# Patient Record
Sex: Female | Born: 1965 | Race: White | Hispanic: No | Marital: Married | Smoking: Current every day smoker
Health system: Southern US, Community
[De-identification: ages and names within clinical notes are randomized; demographics above are authoritative.]

## PROBLEM LIST (undated history)

## (undated) DIAGNOSIS — C801 Malignant (primary) neoplasm, unspecified: Secondary | ICD-10-CM

## (undated) DIAGNOSIS — F329 Major depressive disorder, single episode, unspecified: Secondary | ICD-10-CM

## (undated) DIAGNOSIS — F32A Depression, unspecified: Secondary | ICD-10-CM

## (undated) DIAGNOSIS — N200 Calculus of kidney: Secondary | ICD-10-CM

## (undated) HISTORY — PX: FRACTURE SURGERY: SHX138

---

## 2004-08-29 ENCOUNTER — Emergency Department (HOSPITAL_COMMUNITY): Admission: EM | Admit: 2004-08-29 | Discharge: 2004-08-29 | Payer: Self-pay | Admitting: *Deleted

## 2008-04-11 ENCOUNTER — Emergency Department: Payer: Self-pay | Admitting: Emergency Medicine

## 2009-05-01 ENCOUNTER — Emergency Department: Payer: Self-pay | Admitting: Internal Medicine

## 2014-08-11 ENCOUNTER — Emergency Department: Payer: Commercial Indemnity

## 2014-08-11 ENCOUNTER — Emergency Department
Admission: EM | Admit: 2014-08-11 | Discharge: 2014-08-12 | Disposition: A | Discharge status: Other Acute Inpatient Hospital | Payer: Commercial Indemnity | Attending: Emergency Medicine | Admitting: Emergency Medicine

## 2014-08-11 ENCOUNTER — Encounter: Payer: Self-pay | Admitting: Emergency Medicine

## 2014-08-11 DIAGNOSIS — F329 Major depressive disorder, single episode, unspecified: Secondary | ICD-10-CM | POA: Diagnosis not present

## 2014-08-11 DIAGNOSIS — Y9389 Activity, other specified: Secondary | ICD-10-CM | POA: Insufficient documentation

## 2014-08-11 DIAGNOSIS — X788XXA Intentional self-harm by other sharp object, initial encounter: Secondary | ICD-10-CM | POA: Insufficient documentation

## 2014-08-11 DIAGNOSIS — R45851 Suicidal ideations: Secondary | ICD-10-CM

## 2014-08-11 DIAGNOSIS — F1210 Cannabis abuse, uncomplicated: Secondary | ICD-10-CM

## 2014-08-11 DIAGNOSIS — K0889 Other specified disorders of teeth and supporting structures: Secondary | ICD-10-CM

## 2014-08-11 DIAGNOSIS — Y998 Other external cause status: Secondary | ICD-10-CM | POA: Diagnosis not present

## 2014-08-11 DIAGNOSIS — Y9289 Other specified places as the place of occurrence of the external cause: Secondary | ICD-10-CM | POA: Insufficient documentation

## 2014-08-11 DIAGNOSIS — S61512A Laceration without foreign body of left wrist, initial encounter: Secondary | ICD-10-CM | POA: Diagnosis not present

## 2014-08-11 DIAGNOSIS — Z3202 Encounter for pregnancy test, result negative: Secondary | ICD-10-CM | POA: Diagnosis not present

## 2014-08-11 DIAGNOSIS — N946 Dysmenorrhea, unspecified: Secondary | ICD-10-CM

## 2014-08-11 DIAGNOSIS — F322 Major depressive disorder, single episode, severe without psychotic features: Secondary | ICD-10-CM

## 2014-08-11 DIAGNOSIS — Z720 Tobacco use: Secondary | ICD-10-CM | POA: Insufficient documentation

## 2014-08-11 DIAGNOSIS — F1490 Cocaine use, unspecified, uncomplicated: Secondary | ICD-10-CM

## 2014-08-11 LAB — COMPREHENSIVE METABOLIC PANEL
ALBUMIN: 4.3 g/dL (ref 3.5–5.0)
ALK PHOS: 51 U/L (ref 38–126)
ALT: 10 U/L — ABNORMAL LOW (ref 14–54)
ANION GAP: 8 (ref 5–15)
AST: 16 U/L (ref 15–41)
BILIRUBIN TOTAL: 0.7 mg/dL (ref 0.3–1.2)
BUN: 11 mg/dL (ref 6–20)
CO2: 24 mmol/L (ref 22–32)
CREATININE: 0.6 mg/dL (ref 0.44–1.00)
Calcium: 8.8 mg/dL — ABNORMAL LOW (ref 8.9–10.3)
Chloride: 107 mmol/L (ref 101–111)
GFR calc Af Amer: >60 mL/min (ref >60)
GFR calc non Af Amer: >60 mL/min (ref >60)
GLUCOSE: 118 mg/dL — AB (ref 65–99)
POTASSIUM: 3.8 mmol/L (ref 3.5–5.1)
Sodium: 139 mmol/L (ref 135–145)
Total Protein: 7.7 g/dL (ref 6.5–8.1)

## 2014-08-11 LAB — URINALYSIS COMPLETE WITH MICROSCOPIC (ARMC ONLY)
BACTERIA UA: NONE SEEN
BILIRUBIN URINE: NEGATIVE
GLUCOSE, UA: NEGATIVE mg/dL
KETONES UR: NEGATIVE mg/dL
Leukocytes, UA: NEGATIVE
Nitrite: NEGATIVE
PH: 7 (ref 5.0–8.0)
Protein, ur: NEGATIVE mg/dL
SPECIFIC GRAVITY, URINE: 1.005 (ref 1.005–1.030)

## 2014-08-11 LAB — URINE DRUG SCREEN, QUALITATIVE (ARMC ONLY)
Amphetamines, Ur Screen: NOT DETECTED
BARBITURATES, UR SCREEN: NOT DETECTED
Benzodiazepine, Ur Scrn: NOT DETECTED
Cannabinoid 50 Ng, Ur ~~LOC~~: POSITIVE — AB
Cocaine Metabolite,Ur ~~LOC~~: POSITIVE — AB
MDMA (Ecstasy)Ur Screen: NOT DETECTED
Methadone Scn, Ur: NOT DETECTED
OPIATE, UR SCREEN: NOT DETECTED
Phencyclidine (PCP) Ur S: NOT DETECTED
Tricyclic, Ur Screen: NOT DETECTED

## 2014-08-11 LAB — ACETAMINOPHEN LEVEL: Acetaminophen (Tylenol), Serum: <10 ug/mL — ABNORMAL LOW (ref 10–30)

## 2014-08-11 LAB — CBC
HCT: 40.5 % (ref 35.0–47.0)
Hemoglobin: 13.2 g/dL (ref 12.0–16.0)
MCH: 28.6 pg (ref 26.0–34.0)
MCHC: 32.6 g/dL (ref 32.0–36.0)
MCV: 87.7 fL (ref 80.0–100.0)
PLATELETS: 455 10*3/uL — AB (ref 150–440)
RBC: 4.62 MIL/uL (ref 3.80–5.20)
RDW: 13.4 % (ref 11.5–14.5)
WBC: 16.1 10*3/uL — AB (ref 3.6–11.0)

## 2014-08-11 LAB — SALICYLATE LEVEL

## 2014-08-11 LAB — PREGNANCY, URINE: Preg Test, Ur: NEGATIVE

## 2014-08-11 LAB — ETHANOL: Alcohol, Ethyl (B): <5 mg/dL (ref <5)

## 2014-08-11 MED ORDER — LIDOCAINE-EPINEPHRINE (PF) 1 %-1:200000 IJ SOLN
INTRAMUSCULAR | Status: AC
  Administered 2014-08-11: 13:00:00
  Filled 2014-08-11: qty 30

## 2014-08-11 MED ORDER — IBUPROFEN 600 MG PO TABS
600.0000 mg | ORAL_TABLET | Freq: Once | ORAL | Status: AC
  Administered 2014-08-11: 600 mg via ORAL

## 2014-08-11 MED ORDER — LIDOCAINE-EPINEPHRINE 2 %-1:100000 IJ SOLN
INTRAMUSCULAR | Status: AC
  Filled 2014-08-11: qty 1.7

## 2014-08-11 MED ORDER — IBUPROFEN 600 MG PO TABS
ORAL_TABLET | ORAL | Status: AC
  Administered 2014-08-11: 600 mg via ORAL
  Filled 2014-08-11: qty 1

## 2014-08-11 NOTE — ED Notes (Signed)
Dr.Miller was paged @1022  no response. Called office @ 1245 ER Md discussed xray results with Dr.MIller   Lisa Roca, MD 08/11/14 1650

## 2014-08-11 NOTE — Consult Note (Signed)
[facility] Psychiatry Consult   Reason for Consult:  Suicide attempt by cutting of wrist Referring Physician:  Karma Greaser Patient Identification: Darlene Franco MRN:  [id number] Principal Diagnosis: Depression, major, single episode, severe Diagnosis:   Patient Active Problem List   Diagnosis Date Noted  . Depression, major, single episode, severe [F32.2] 08/11/2014  . Cocaine use [F14.10] 08/11/2014  . Marijuana abuse [F12.10] 08/11/2014    Total Time spent with patient: 1 hour  Subjective:   Darlene Franco is a 49 y.o. female patient admitted with cut wrist and states explicit suicidal ideation .  HPI:  Patient reports months of depressed mood and poor sleep, poor energy, suicidal ideation. Denies cocaine use  HPI Elements:   Quality:  depression. Severity:  severe. Timing:  worsening over weeks. Duration:  many months. Context:  likely substance abuse. Possible medical problems.  Past Medical History: No past medical history on file. History reviewed. No pertinent past surgical history. Family History: No family history on file. Social History:  History  Alcohol Use No     History  Drug Use  . Yes  . Special: Marijuana    History   Social History  . Marital Status: Married    Spouse Name: N/A  . Number of Children: N/A  . Years of Education: N/A   Social History Main Topics  . Smoking status: Current Every Day Smoker  . Smokeless tobacco: Not on file  . Alcohol Use: No  . Drug Use: Yes    Special: Marijuana  . Sexual Activity: Not on file   Other Topics Concern  . None   Social History Narrative  . None   Additional Social History:                          Allergies:  No Known Allergies  Labs:  Results for orders placed or performed during the hospital encounter of 08/11/14 (from the past 48 hour(s))  Acetaminophen level     Status: Abnormal   Collection Time: 08/11/14  9:10 AM  Result Value Ref Range   Acetaminophen  (Tylenol), Serum <10 (L) 10 - 30 ug/mL  CBC     Status: Abnormal   Collection Time: 08/11/14  9:10 AM  Result Value Ref Range   WBC 16.1 (H) 3.6 - 11.0 K/uL   RBC 4.62 3.80 - 5.20 MIL/uL   Hemoglobin 13.2 12.0 - 16.0 g/dL   HCT 40.5 35.0 - 47.0 %   MCV 87.7 80.0 - 100.0 fL   MCH 28.6 26.0 - 34.0 pg   MCHC 32.6 32.0 - 36.0 g/dL   RDW 13.4 11.5 - 14.5 %   Platelets 455 (H) 150 - 440 K/uL  Comprehensive metabolic panel     Status: Abnormal   Collection Time: 08/11/14  9:10 AM  Result Value Ref Range   Sodium 139 135 - 145 mmol/L   Potassium 3.8 3.5 - 5.1 mmol/L   Chloride 107 101 - 111 mmol/L   CO2 24 22 - 32 mmol/L   Glucose, Bld 118 (H) 65 - 99 mg/dL   BUN 11 6 - 20 mg/dL   Creatinine, Ser 0.60 0.44 - 1.00 mg/dL   Calcium 8.8 (L) 8.9 - 10.3 mg/dL   Total Protein 7.7 6.5 - 8.1 g/dL   Albumin 4.3 3.5 - 5.0 g/dL   AST 16 15 - 41 U/L   ALT 10 (L) 14 - 54 U/L   Alkaline Phosphatase 51 38 -  126 U/L   Total Bilirubin 0.7 0.3 - 1.2 mg/dL   GFR calc non Af Amer >60 >60 mL/min   GFR calc Af Amer >60 >60 mL/min    Comment: (NOTE) The eGFR has been calculated using the CKD EPI equation. This calculation has not been validated in all clinical situations. eGFR's persistently <90 mL/min signify possible Chronic Kidney Disease.    Anion gap 8 5 - 15  Ethanol (ETOH)     Status: None   Collection Time: 08/11/14  9:10 AM  Result Value Ref Range   Alcohol, Ethyl (B) <5 <5 mg/dL  Salicylate level     Status: None   Collection Time: 08/11/14  9:10 AM  Result Value Ref Range   Salicylate Lvl <7.0 2.8 - 30.0 mg/dL  Pregnancy, urine     Status: None   Collection Time: 08/11/14  9:29 AM  Result Value Ref Range   Preg Test, Ur NEGATIVE NEGATIVE  Urinalysis complete, with microscopic     Status: Abnormal   Collection Time: 08/11/14  9:29 AM  Result Value Ref Range   Color, Urine STRAW (A) YELLOW   APPearance CLEAR (A) CLEAR   Glucose, UA NEGATIVE NEGATIVE mg/dL   Bilirubin Urine NEGATIVE  NEGATIVE   Ketones, ur NEGATIVE NEGATIVE mg/dL   Specific Gravity, Urine 1.005 1.005 - 1.030   Hgb urine dipstick 2+ (A) NEGATIVE   pH 7.0 5.0 - 8.0   Protein, ur NEGATIVE NEGATIVE mg/dL   Nitrite NEGATIVE NEGATIVE   Leukocytes, UA NEGATIVE NEGATIVE   RBC / HPF 0-5 0 - 5 RBC/hpf   WBC, UA 0-5 0 - 5 WBC/hpf   Bacteria, UA NONE SEEN NONE SEEN   Squamous Epithelial / LPF 0-5 (A) NONE SEEN  Urine Drug Screen, Qualitative     Status: Abnormal   Collection Time: 08/11/14  9:29 AM  Result Value Ref Range   Tricyclic, Ur Screen NONE DETECTED NONE DETECTED   Amphetamines, Ur Screen NONE DETECTED NONE DETECTED   MDMA (Ecstasy)Ur Screen NONE DETECTED NONE DETECTED   Cocaine Metabolite,Ur [place] POSITIVE (A) NONE DETECTED   Opiate, Ur Screen NONE DETECTED NONE DETECTED   Phencyclidine (PCP) Ur S NONE DETECTED NONE DETECTED   Cannabinoid 50 Ng, Ur [place] POSITIVE (A) NONE DETECTED   Barbiturates, Ur Screen NONE DETECTED NONE DETECTED   Benzodiazepine, Ur Scrn NONE DETECTED NONE DETECTED   Methadone Scn, Ur NONE DETECTED NONE DETECTED    Comment: (NOTE) 340  Tricyclics, urine               Cutoff 1000 ng/mL 200  Amphetamines, urine             Cutoff 1000 ng/mL 300  MDMA (Ecstasy), urine           Cutoff 500 ng/mL 400  Cocaine Metabolite, urine       Cutoff 300 ng/mL 500  Opiate, urine                   Cutoff 300 ng/mL 600  Phencyclidine (PCP), urine      Cutoff 25 ng/mL 700  Cannabinoid, urine              Cutoff 50 ng/mL 800  Barbiturates, urine             Cutoff 200 ng/mL 900  Benzodiazepine, urine           Cutoff 200 ng/mL 1000 Methadone, urine  Cutoff 300 ng/mL 1100 1200 The urine drug screen provides only a preliminary, unconfirmed 1300 analytical test result and should not be used for non-medical 1400 purposes. Clinical consideration and professional judgment should 1500 be applied to any positive drug screen result due to possible 1600 interfering substances. A more  specific alternate chemical method 1700 must be used in order to obtain a confirmed analytical result.  1800 Gas chromato graphy / mass spectrometry (GC/MS) is the preferred 1900 confirmatory method.     Vitals: Blood pressure 123/71, pulse 72, temperature 98 F (36.7 C), temperature source Oral, resp. rate 20, height $RemoveBe'5\' 6"'BWAmeQGxn$  (1.676 m), weight 54.432 kg (120 lb), SpO2 94 %.  Risk to Self: Is patient at risk for suicide?: Yes Risk to Others:   Prior Inpatient Therapy:   Prior Outpatient Therapy:    No current facility-administered medications for this encounter.   No current outpatient prescriptions on file.    Musculoskeletal: Strength & Muscle Tone: increased Gait & Station: ataxic Patient leans: N/A  Psychiatric Specialty Exam: Physical Exam  Constitutional: She appears cachectic. She has a sickly appearance. She appears distressed.  Eyes: Pupils are equal, round, and reactive to light.  Neurological: She is alert.  Skin: She is diaphoretic.  Psychiatric: Her mood appears anxious. Her affect is labile. Her speech is rapid and/or pressured. She is agitated. Cognition and memory are impaired. She expresses impulsivity and inappropriate judgment. She exhibits a depressed mood. She expresses suicidal ideation. She expresses suicidal plans. She exhibits abnormal recent memory and abnormal remote memory.    Review of Systems  Constitutional: Positive for fever, weight loss, malaise/fatigue and diaphoresis.  Psychiatric/Behavioral: Positive for depression, suicidal ideas, memory loss and substance abuse. The patient is nervous/anxious and has insomnia.     Blood pressure 123/71, pulse 72, temperature 98 F (36.7 C), temperature source Oral, resp. rate 20, height $RemoveBe'5\' 6"'SbemSjBSW$  (1.676 m), weight 54.432 kg (120 lb), SpO2 94 %.Body mass index is 19.38 kg/(m^2).  General Appearance: Disheveled  Eye Contact::  Absent  Speech:  Pressured  Volume:  Increased  Mood:  Depressed  Affect:   Depressed and Tearful  Thought Process:  Disorganized  Orientation:  Full (Time, Place, and Person)  Thought Content:  WDL  Suicidal Thoughts:  Yes.  with intent/plan  Homicidal Thoughts:  No  Memory:  Immediate;   Poor Recent;   Poor Remote;   Poor  Judgement:  Impaired  Insight:  Lacking and Present  Psychomotor Activity:  Restlessness  Concentration:  Negative  Recall:  Negative  Fund of Knowledge:Fair  Language: Fair  Akathisia:  No  Handed:  Right  AIMS (if indicated):     Assets:  Housing  ADL's:  Impaired  Cognition: Impaired,  Moderate  Sleep:      Medical Decision Making: New problem, with additional work up planned, Review of Psycho-Social Stressors (1), Review or order clinical lab tests (1), Discuss test with performing physician (1) and Review of Medication Regimen & Side Effects (2)  Treatment Plan Summary: Plan Review labs. Admit to psychiatry. Precautions. Does not appear to need one on one sitter right now. Discuss plan with patient and ER physicain and psychiatric team  Plan:  Recommend psychiatric Inpatient admission when medically cleared. admit to psychiatry ttentative dx major depression single severe Disposition: admit under IVC  CLAPACS, [facility] 08/11/2014 9:55 PM

## 2014-08-11 NOTE — ED Notes (Signed)
BEHAVIORAL HEALTH ROUNDING Patient sleeping: Yes.   Patient alert and oriented: no, sleeping Behavior appropriate: Yes.  ; If no, describe: sleeping Nutrition and fluids offered: No and sleeping Toileting and hygiene offered: No and sleeping Sitter present: yes Law enforcement present: Yes  and ODS

## 2014-08-11 NOTE — ED Notes (Signed)

## 2014-08-11 NOTE — ED Notes (Signed)
BEHAVIORAL HEALTH ROUNDING Patient sleeping: Yes.   Patient alert and oriented: sleeping Behavior appropriate: Yes.  ; If no, describe:  Nutrition and fluids offered: sleeping Toileting and hygiene offered: sleeping Sitter present: yes Law enforcement present: Yes

## 2014-08-11 NOTE — ED Notes (Signed)
Sutures placed by Dr. Reita Cliche at this time.  Total of 4 sutures

## 2014-08-11 NOTE — ED Provider Notes (Signed)
[facility] Emergency Department Provider Note    ____________________________________________  Time seen: 9:15 AM  I have reviewed the triage vital signs and the nursing notes.   HISTORY  Chief Complaint Suicide Attempt   History limited by severe depression/poor historian. Sister provided most of the history    HPI Darlene Franco is a 49 y.o. female who sliced her left wrist with intent for suicide she states that she "really messed up".She does not have a diagnosis of depression or take medication for this. States that she sliced her wrists at around 4:30 AM. She had been drinking before that in order to "deaden the pain". She has mild pain at the left wrist. Severity of laceration is moderate. Her tympanic impression is severe and she is still crying.     No past medical history on file.  There are no active problems to display for this patient.   History reviewed. No pertinent past surgical history.  No current outpatient prescriptions on file.  Allergies Review of patient's allergies indicates no known allergies.  No family history on file.  Social History History  Substance Use Topics  . Smoking status: Current Every Day Smoker  . Smokeless tobacco: Not on file  . Alcohol Use: No    Review of Systems Limited as patient is poor historian Constitutional: Negative for fever. Eyes: Negative for visual changes. ENT: Negative for sore throat. Cardiovascular: Negative for chest pain. Respiratory: Negative for shortness of breath. Gastrointestinal: Negative for abdominal pain, vomiting and diarrhea. Genitourinary: Negative for dysuria. Musculoskeletal: Negative for back pain. Skin: Left wrist and laceration volar aspect Neurological: Negative for headaches, focal weakness or numbness. Psychiatric: Depressed and crying with moderate insight. States that she cut her wrist in order to die  10-point ROS otherwise  negative.  ____________________________________________   PHYSICAL EXAM:  VITAL SIGNS: ED Triage Vitals  Enc Vitals Group     BP 08/11/14 0900 117/71 mmHg     Pulse Rate 08/11/14 0900 125     Resp --      Temp 08/11/14 0900 98 F (36.7 C)     Temp Source 08/11/14 0900 Oral     SpO2 08/11/14 0900 100 %     Weight 08/11/14 0900 120 lb (54.432 kg)     Height 08/11/14 0900 5\' 6"  (1.676 m)     Head Cir --      Peak Flow --      Pain Score --      Pain Loc --      Pain Edu? --      Excl. in [place]? --      Constitutional: Crying and depressed with suicidal ideation. No acute distress Eyes: Conjunctivae are normal. PERRL. Normal extraocular movements. Tearful ENT   Head: Normocephalic and atraumatic.   Nose: No congestion/rhinnorhea.   Mouth/Throat: Mucous membranes are moist.   Neck: No stridor. Hematological/Lymphatic/Immunilogical: No cervical lymphadenopathy. Cardiovascular: Normal rate, regular rhythm. Normal and symmetric distal pulses are present in all extremities. No murmurs, rubs, or gallops. Respiratory: Normal respiratory effort without tachypnea nor retractions. Breath sounds are clear and equal bilaterally. No wheezes/rales/rhonchi. Gastrointestinal: Soft and nontender. No distention. No abdominal bruits. There is no CVA tenderness. Genitourinary: Deferred Musculoskeletal: Nontender with normal range of motion in all extremities. No joint effusions.  No lower extremity tenderness nor edema. She has a approximately 4 cm laceration to the volar aspect of the left wrist with exposure of a flexor tendon with at least a partial  laceration. The wound is hemostatic. There are good pulses in that wrist. She has good flexion of all fingers at each joint against resistance. Neurologic:  Normal speech and language. No gross focal neurologic deficits are appreciated. Speech is normal.  Skin:  No rash noted Psychiatric: Depressed mood and affect. Moderate insight. Reports  suicidal ideation.  ____________________________________________    LABS (pertinent positives/negatives)  Urine drug screen positive for cocaine and marijuana. White blood cell count 16 and hemoglobin 13  ____________________________________________   EKG    ____________________________________________    RADIOLOGY  Reviewed radiology results of left wrist no fracture or radiopaque body. Soft tissue changes of defect volar wrist  ____________________________________________   PROCEDURES  Procedure(s) performed: laceration, see procedure note(s).  Critical Care performed: No  LACERATION REPAIR Performed by: Lisa Roca Authorized by: Lisa Roca Consent: Verbal consent obtained. Risks and benefits: risks, benefits and alternatives were discussed Consent given by: patient Patient identity confirmed: provided demographic data Prepped and Draped in normal sterile fashion Wound explored  Laceration Location:  left volar wrist  Laceration Length 5cm  No Foreign body  seen or palpated  Anesthesia: local infiltration  Local anesthetic: lidocaine 1% no epinephrine  Irrigation method: syringe Amount of cleaning: standard    Patient tolerance: Patient tolerated the procedure well with no immediate complications. __________________________________   INITIAL IMPRESSION / ASSESSMENT AND PLAN / ED COURSE  Pertinent labs & imaging results that were available during my care of the patient were reviewed by me and considered in my medical decision making (see chart for details).  Patient was placed on involuntary commitment due to verbalized sensation of suicidal ideation including suicidal intent with self-inflicted injury to her left wrist. After negative x-ray laceration was repaired. No functional deficit although there was some visualized flexor palmaris injury. Dr. Sabra Heck of orthopedics recommended no particular tendon repair and recommended suturing the skin.  Consultation was made to psychiatric.  Emergency Department care transferred to Dr. Karma Greaser ____________________________________________   FINAL CLINICAL IMPRESSION(S) / ED DIAGNOSES  Final diagnoses:  Laceration of wrist, left, initial encounter  Suicidal ideations     Lisa Roca, MD 08/11/14 [phone]

## 2014-08-11 NOTE — ED Notes (Signed)
Pt. Noted in room. No complaints or concerns voiced. No distress or abnormal behavior noted. Will continue to monitor with security cameras. Q 15 minute rounds continue. 

## 2014-08-11 NOTE — ED Notes (Signed)
Report given to Dominica Severin, Therapist, sports. Pt transferred to ED BHU. Pt accompanied by EDT and [employer] officer.

## 2014-08-11 NOTE — ED Notes (Addendum)
Patient assessed at this time s/p suicide attempt.  She reports around 0400 this morning she wanted to cut herself to "bleed and go to sleep and not wake up."  Patient reports this is her first suicide attempt and had a nephew commit suicide 3 months ago.  She states "I screwed up big time."  Darlene Franco reports she stole jewelry to pawn from a co-worker because she recently had dental work done and was having pain and did not have any money for pain medication.  She was caught by her co-worker and a Engineer, structural came to her house and told her to turn herself in.  She states she has been having suicidal ideation for the past several months.  She did not have plan to hurt herself until this morning. She did report later on during the assessment that she had thought about hanging herself last night.  She states "I don't want to be a burden" Denies any HI.  She reports she takes 1-2 Vicodin a day for pain control. She does not have access to guns at home.  Current daily pot smoker, and states "if I don't smoke pot, I would've killed myself a long time ago." Patient is actively suicidal at this time.

## 2014-08-11 NOTE — ED Notes (Addendum)
Patient states she tried and slit wrists this morning around 4 am, states she bled a lot, called sister this morning and stated she had slit her wrists, denies taking mediation Sister and pt states no prior treatment for suicide or substance abuse

## 2014-08-11 NOTE — ED Notes (Signed)
Pt. To BHU from ED ambulatory without difficulty, to room 2 . Report from [facility]. Pt. Is alert and oriented, warm and dry in no distress. Pt. Denies SI, HI, and AVH. Pt. Calm and cooperative. Pt. Made aware of security cameras and Q15 minute rounds. Pt. Encouraged to let Nursing staff know of any concerns or needs.

## 2014-08-11 NOTE — BH Assessment (Signed)
Assessment Note  Darlene Franco is an 49 y.o. female, who presents to the ED via EMS for c/o a self inflicted laceration to left wrist in a suicide attempt. Per patient, "I wanted to die; just let me die; don't fix my arm; let it bleed; I just wanted to die; I don't want to talk; just let me die."  Axis I: Bipolar, Depressed and Major Depression, single episode Axis II: Deferred Axis III: History reviewed. No pertinent past medical history. Axis IV: other psychosocial or environmental problems, problems related to social environment and problems with access to health care services Axis V: 11-20 some danger of hurting self or others possible OR occasionally fails to maintain minimal personal hygiene OR gross impairment in communication  Past Medical History: History reviewed. No pertinent past medical history.  History reviewed. No pertinent past surgical history.  Family History:  Family History  Problem Relation Age of Onset  . Suicidality Cousin   . Suicidality Other     Social History:  reports that she has been smoking.  She does not have any smokeless tobacco history on file. She reports that she uses illicit drugs (Marijuana and Cocaine). She reports that she does not drink alcohol.  Additional Social History:     CIWA: CIWA-Ar BP: 123/71 mmHg Pulse Rate: 72 COWS:    Allergies: No Known Allergies  Home Medications:  (Not in a hospital admission)  OB/GYN Status:  No LMP recorded.  General Assessment Data Location of Assessment: [facility] ED TTS Assessment: In system Is this a Tele or Face-to-Face Assessment?: Face-to-Face Is this an Initial Assessment or a Re-assessment for this encounter?: Initial Assessment Marital status: Married Is patient pregnant?: No Pregnancy Status: No Living Arrangements: Spouse/significant other Can pt return to current living arrangement?: Yes Admission Status: Involuntary Is patient capable of signing voluntary admission?: No Referral  Source: Self/Family/Friend Insurance type: Ship broker Exam ([place]) Medical Exam completed: Yes  Crisis Care Plan Living Arrangements: Spouse/significant other Name of Psychiatrist: none Name of Therapist: none  Education Status Is patient currently in school?: No Contact person: husband  Risk to self with the past 6 months Suicidal Ideation: Yes-Currently Present Has patient been a risk to self within the past 6 months prior to admission? : Yes Suicidal Intent: Yes-Currently Present (cut left wrist) Has patient had any suicidal intent within the past 6 months prior to admission? : Yes Is patient at risk for suicide?: Yes Suicidal Plan?: Yes-Currently Present Has patient had any suicidal plan within the past 6 months prior to admission? : Yes Specify Current Suicidal Plan: cut left wrist Access to Means: Yes Specify Access to Suicidal Means: sharp items What has been your use of drugs/alcohol within the last 12 months?: none Previous Attempts/Gestures: Yes How many times?: 2 Other Self Harm Risks: depression; saddness; helpless; hopeless Triggers for Past Attempts: Spouse contact, Family contact Intentional Self Injurious Behavior: Cutting Comment - Self Injurious Behavior: cutting Family Suicide History: No Recent stressful life event(s): Conflict (Comment), Financial Problems Persecutory voices/beliefs?: No Depression: Yes Depression Symptoms: Tearfulness, Feeling worthless/self pity, Loss of interest in usual pleasures Substance abuse history and/or treatment for substance abuse?: No Suicide prevention information given to non-admitted patients: Yes  Risk to Others within the past 6 months Homicidal Ideation: No Does patient have any lifetime risk of violence toward others beyond the six months prior to admission? : No Thoughts of Harm to Others: No Current Homicidal Intent: No Current Homicidal Plan: No Access  to Homicidal Means:  No Identified Victim: none History of harm to others?: No Assessment of Violence: On admission Violent Behavior Description: none Does patient have access to weapons?: No Criminal Charges Pending?: No Does patient have a court date: No Is patient on probation?: No  Psychosis Hallucinations: None noted Delusions: None noted  Mental Status Report Appearance/Hygiene: Disheveled, Poor hygiene Eye Contact: Poor Motor Activity: Unremarkable Speech: Slurred Level of Consciousness: Sleeping Mood: Depressed, Sad Affect: Depressed, Sad Anxiety Level: Moderate Thought Processes: Circumstantial Judgement: Impaired Orientation: Person, Place Obsessive Compulsive Thoughts/Behaviors: None  Cognitive Functioning Concentration: Decreased Memory: Recent Impaired Insight: Poor Impulse Control: Poor Appetite: Poor Sleep: Decreased Total Hours of Sleep: 2 Vegetative Symptoms: Staying in bed, Not bathing, Decreased grooming  ADLScreening [facility] Assessment Services) Patient's cognitive ability adequate to safely complete daily activities?: No Patient able to express need for assistance with ADLs?: Yes Independently performs ADLs?: Yes (appropriate for developmental age)  Prior Inpatient Therapy Prior Inpatient Therapy: No Prior Therapy Dates: unknown Prior Therapy Facilty/Provider(s): none Reason for Treatment: none  Prior Outpatient Therapy Prior Outpatient Therapy: No Prior Therapy Dates: none Prior Therapy Facilty/Provider(s): none Reason for Treatment: none Does patient have an ACCT team?: No Does patient have Intensive In-House Services?  : No Does patient have [facility] services? : No Does patient have P4CC services?: Unknown  ADL Screening (condition at time of admission) Patient's cognitive ability adequate to safely complete daily activities?: No Patient able to express need for assistance with ADLs?: Yes Independently performs ADLs?: Yes (appropriate for developmental  age)       Abuse/Neglect Assessment (Assessment to be complete while patient is alone) Physical Abuse: Denies Verbal Abuse: Denies Sexual Abuse: Denies Exploitation of patient/patient's resources: Denies Self-Neglect: Denies Values / Beliefs Cultural Requests During Hospitalization: None Spiritual Requests During Hospitalization: None Consults Spiritual Care Consult Needed: No Social Work Consult Needed: No Regulatory affairs officer (For Healthcare) Does patient have an advance directive?: No    Additional Information 1:1 In Past 12 Months?: No CIRT Risk: No Elopement Risk: No Does patient have medical clearance?: Yes     Disposition:  Disposition Initial Assessment Completed for this Encounter: Yes Disposition of Patient: Inpatient treatment program Type of inpatient treatment program: Adult  On Site Evaluation by:   Reviewed with Physician:    Maris Berger 08/11/2014 10:20 PM

## 2014-08-11 NOTE — ED Notes (Signed)
BEHAVIORAL HEALTH ROUNDING Patient sleeping: No. Patient alert and oriented: yes Behavior appropriate: Yes.  ; If no, describe:  Nutrition and fluids offered: Yes  Toileting and hygiene offered: Yes  Sitter present: yes Law enforcement present: Yes  

## 2014-08-11 NOTE — ED Notes (Signed)
BEHAVIORAL HEALTH ROUNDING Patient sleeping: Yes.   Patient alert and oriented: yes Behavior appropriate: Yes.  ; If no, describe:  Nutrition and fluids offered: Yes  Toileting and hygiene offered: Yes  Sitter present: yes Law enforcement present: Yes  

## 2014-08-11 NOTE — ED Notes (Signed)
Patient assigned to appropriate care area. Patient oriented to unit/care area: Informed that, for their safety, care areas are designed for safety and monitored at all times; and visiting hours explained to patient. Patient verbalizes understanding, and verbal contract for safety obtained.

## 2014-08-12 ENCOUNTER — Inpatient Hospital Stay
Admission: EM | Admit: 2014-08-12 | Discharge: 2014-08-14 | DRG: 897 | Disposition: A | Discharge status: Home / Self Care | Payer: 59 | Source: Intra-hospital | Attending: Psychiatry | Admitting: Psychiatry

## 2014-08-12 DIAGNOSIS — Z915 Personal history of self-harm: Secondary | ICD-10-CM

## 2014-08-12 DIAGNOSIS — G4700 Insomnia, unspecified: Secondary | ICD-10-CM | POA: Diagnosis present

## 2014-08-12 DIAGNOSIS — N946 Dysmenorrhea, unspecified: Secondary | ICD-10-CM | POA: Diagnosis present

## 2014-08-12 DIAGNOSIS — Z599 Problem related to housing and economic circumstances, unspecified: Secondary | ICD-10-CM | POA: Diagnosis not present

## 2014-08-12 DIAGNOSIS — F1010 Alcohol abuse, uncomplicated: Secondary | ICD-10-CM | POA: Diagnosis present

## 2014-08-12 DIAGNOSIS — F1210 Cannabis abuse, uncomplicated: Secondary | ICD-10-CM | POA: Diagnosis present

## 2014-08-12 DIAGNOSIS — F1410 Cocaine abuse, uncomplicated: Secondary | ICD-10-CM | POA: Diagnosis present

## 2014-08-12 DIAGNOSIS — F322 Major depressive disorder, single episode, severe without psychotic features: Secondary | ICD-10-CM | POA: Diagnosis present

## 2014-08-12 DIAGNOSIS — R634 Abnormal weight loss: Secondary | ICD-10-CM | POA: Diagnosis present

## 2014-08-12 DIAGNOSIS — R45851 Suicidal ideations: Secondary | ICD-10-CM | POA: Diagnosis present

## 2014-08-12 DIAGNOSIS — Z818 Family history of other mental and behavioral disorders: Secondary | ICD-10-CM | POA: Diagnosis not present

## 2014-08-12 DIAGNOSIS — Z859 Personal history of malignant neoplasm, unspecified: Secondary | ICD-10-CM | POA: Diagnosis not present

## 2014-08-12 DIAGNOSIS — F17210 Nicotine dependence, cigarettes, uncomplicated: Secondary | ICD-10-CM | POA: Diagnosis present

## 2014-08-12 DIAGNOSIS — Z653 Problems related to other legal circumstances: Secondary | ICD-10-CM | POA: Diagnosis not present

## 2014-08-12 HISTORY — DX: Malignant (primary) neoplasm, unspecified: C80.1

## 2014-08-12 HISTORY — DX: Major depressive disorder, single episode, unspecified: F32.9

## 2014-08-12 HISTORY — DX: Depression, unspecified: F32.A

## 2014-08-12 LAB — TSH: TSH: 2.145 u[IU]/mL (ref 0.350–4.500)

## 2014-08-12 MED ORDER — TRAZODONE HCL 50 MG PO TABS
50.0000 mg | ORAL_TABLET | Freq: Every day | ORAL | Status: DC
  Administered 2014-08-12: 50 mg via ORAL
  Filled 2014-08-12: qty 1

## 2014-08-12 MED ORDER — ACETAMINOPHEN 325 MG PO TABS: 650.0000 mg | ORAL_TABLET | Freq: Four times a day (QID) | ORAL | Status: DC | PRN

## 2014-08-12 MED ORDER — MIRTAZAPINE 15 MG PO TBDP
15.0000 mg | ORAL_TABLET | Freq: Every day | ORAL | Status: DC
  Administered 2014-08-12 – 2014-08-13 (×2): 15 mg via ORAL
  Filled 2014-08-12 (×5): qty 1

## 2014-08-12 MED ORDER — ALUM & MAG HYDROXIDE-SIMETH 200-200-20 MG/5ML PO SUSP: 30.0000 mL | ORAL | Status: DC | PRN

## 2014-08-12 MED ORDER — ENSURE ENLIVE PO LIQD
237.0000 mL | Freq: Two times a day (BID) | ORAL | Status: DC
  Administered 2014-08-12 – 2014-08-14 (×6): 237 mL via ORAL

## 2014-08-12 MED ORDER — MAGNESIUM HYDROXIDE 400 MG/5ML PO SUSP: 30.0000 mL | Freq: Every day | ORAL | Status: DC | PRN

## 2014-08-12 NOTE — Tx Team (Signed)
Initial Interdisciplinary Treatment Plan   PATIENT STRESSORS: Legal issue Substance abuse   PATIENT STRENGTHS: Ability for insight General fund of knowledge Motivation for treatment/growth   PROBLEM LIST: Problem List/Patient Goals Date to be addressed Date deferred Reason deferred Estimated date of resolution  Depression 08/12/14     Suicidal 08/12/14     Substance Abuse 08/12/14                                          DISCHARGE CRITERIA:  Improved stabilization in mood, thinking, and/or behavior  PRELIMINARY DISCHARGE PLAN: Outpatient therapy  PATIENT/FAMIILY INVOLVEMENT: This treatment plan has been presented to and reviewed with the patient, Darlene Franco, and/or family member.  The patient and family have been given the opportunity to ask questions and make suggestions.  [name] 08/12/2014, 6:31 AM

## 2014-08-12 NOTE — Plan of Care (Signed)
Problem: Ineffective individual coping Goal: STG: Patient will remain free from self harm Outcome: Progressing Has not tried to harm self today.

## 2014-08-12 NOTE — Progress Notes (Signed)
Recreation Therapy Notes  Recreation Therapy Group Note Template    Date: [id number] Time: 3:00 pm Location: Craft Room  Group Topic: Coping Skills  Goal Area(s) Addresses:  Patient will identify things they are grateful for. Patient will identify how being grateful can influence decision making.  Behavioral Response: Did not attend  Intervention: Grateful Wheel  Activity: Patients were given an "I Am Grateful For" worksheet and instructed to list 2-3 things per each category that they are grateful for.  Education: LRT educated patients on leisure and why it is important to implement it into their schedules.  Education Outcome: Patient did not attend group.  Clinical Observations/Feedback: Patient did not attend group.   Leonette [facility], LRT/CTRS 08/12/2014 5:26 PM

## 2014-08-12 NOTE — BHH Suicide Risk Assessment (Signed)
[facility] Admission Suicide Risk Assessment   Nursing information obtained from:    Demographic factors:    Current Mental Status:    Loss Factors:    Historical Factors:    Risk Reduction Factors:    Total Time spent with patient: 1 hour Principal Problem: Depression, major, single episode, severe Diagnosis:   Patient Active Problem List   Diagnosis Date Noted  . 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 Depression, major, single episode, severe [F32.2] 08/11/2014  . Cocaine use [F14.10] 08/11/2014  . Marijuana abuse [F12.10] 08/11/2014  . Pain, dental [K08.8] 08/11/2014  . Dysmenorrhea [N94.6] 08/11/2014     Continued Clinical Symptoms:    The "Alcohol Use Disorders Identification Test", Guidelines for Use in Primary Care, Second Edition.  World Pharmacologist [facility]). Score between 0-7:  no or low risk or alcohol related problems. Score between 8-15:  moderate risk of alcohol related problems. Score between 16-19:  high risk of alcohol related problems. Score 20 or above:  warrants further diagnostic evaluation for alcohol dependence and treatment.   CLINICAL FACTORS:   Depression:   Comorbid alcohol abuse/dependence Insomnia Alcohol/Substance Abuse/Dependencies   Musculoskeletal: Strength & Muscle Tone: within normal limits Gait & Station: normal Patient leans: N/A  Psychiatric Specialty Exam: Physical Exam  ROS Review of Systems - Negative except abdominalm pain with menstruation and headache.  Blood pressure 107/75, pulse 91, temperature 98.3 F (36.8 C), temperature source Oral, resp. rate 20, height 5\' 6"  (1.676 m), weight 44.453 kg (98 lb), last menstrual period 08/11/2014.Body mass index is 15.83 kg/(m^2).  General Appearance: Disheveled  Eye Contact::  Good  Speech:  Normal Rate  Volume:  Decreased  Mood:  Depressed  Affect:  Tearful  Thought Process:  Coherent  Orientation:  Full (Time, Place, and Person)   Thought Content:  WDL  Suicidal Thoughts:  No  Homicidal Thoughts:  No  Memory:  Immediate;   Fair Recent;   Fair Remote;   Fair  Judgement:  Impaired  Insight:  Fair  Psychomotor Activity:  Decreased  Concentration:  Poor  Recall:  [place]  Language: Fair  Akathisia:  No  Handed:  Right  AIMS (if indicated):     Assets:  Desire for Improvement Housing Physical Health Social Support  Sleep:  Number of Hours: 0  Cognition: WNL  ADL's:  Intact     COGNITIVE FEATURES THAT CONTRIBUTE TO RISK:  None    SUICIDE RISK:   Moderate:  Frequent suicidal ideation with limited intensity, and duration, some specificity in terms of plans, no associated intent, good self-control, limited dysphoria/symptomatology, some risk factors present, and identifiable protective factors, including available and accessible social support.  PLAN OF CARE: Ms. Mazo was admitted to [facility] for safety, stabilization and medication menagment. She is on suicide precautions, will be started on antidepressant. She will participate in groups to learn coping skills and address substance use.   Medical Decision Making:  Review of Psycho-Social Stressors (1), New Problem, with no additional work-up planned (3) and Review of New Medication or Change in Dosage (2)  I certify that inpatient services furnished can reasonably be expected to improve the patient's condition.   Orson Slick 08/12/2014, 12:41 PM

## 2014-08-12 NOTE — Progress Notes (Signed)
Pt. Has been isolative to her room; sleeping.  Patient not interacting with peers.  Appetite poor. Ensure ordered.

## 2014-08-12 NOTE — Plan of Care (Signed)
Problem: Ineffective individual coping Goal: LTG: Patient will report a decrease in negative feelings Outcome: Progressing Denies suicidal feelings today.

## 2014-08-12 NOTE — H&P (Signed)
Psychiatric Admission Assessment Adult  Patient Identification: Darlene Franco MRN:  [id number] Date of Evaluation:  08/12/2014 Chief Complaint:  mdd Principal Diagnosis: Depression, major, single episode, severe Diagnosis:   Patient Active Problem List   Diagnosis Date Noted  . 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 Depression, major, single episode, severe [F32.2] 08/11/2014  . Cocaine use [F14.10] 08/11/2014  . Marijuana abuse [F12.10] 08/11/2014  . Pain, dental [K08.8] 08/11/2014  . Dysmenorrhea [N94.6] 08/11/2014   History of Present Illness:: Darlene Franco is a 49 year old female with no past psychiatric history. 3 months ago her nephew hanging himself he was 55 year old tall and had problems with his significant other. The patient had to cut the rope down. She became increasingly depressed for the past 2 or 3 months. She reports poor sleep, decreased appetite, 30 pound weight loss, poor sleep, energy and concentration, feeling of guilt, hopelessness and worthlessness, social isolation, crying spells, that coordinated in a suicide attempt by cutting her wrist. The patient felt that she was "" crazy". She attempted suicide impulsively. She did not plan that suicide. She is glad to be alive. She reports multiple social stressors in the past several months in addition to losing her nephew. She lost her job. She says she messed up but is unwilling to explain what really happened. 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 This is the job that ended. There are financial problems. She does not feel physically well thinks that she is premenopausal. Has hot flashes, her thoughts are racing feels like losing her mind at times. She denies psychotic symptoms. She denies symptoms suggestive of bipolar mania. She denies anxiety. She had some alcohol while cutting but denies heavy drinking. She admits to smoking cocaine and  cannabis.   PAST PSYCHIATRIC HISTORY: She has never been hospitalized. Never taken psychotropic medications. No suicide attempts. No substance abuse treatment.  FAMILY PSYCHIATRIC HISTORY: Nephew who hanged himself.  Total Time spent with patient: 1 hour  Past Medical History:  Past Medical History  Diagnosis Date  . Depression   . Cancer     Past Surgical History  Procedure Laterality Date  . Fracture surgery     Family History:  Family History  Problem Relation Age of Onset  . Suicidality Cousin   . Suicidality Other    Social History:  History  Alcohol Use  . Yes     History  Drug Use  . Yes  . Special: Marijuana, Cocaine, Benzodiazepines    History   Social History  . Marital Status: Married    Spouse Name: N/A  . Number of Children: N/A  . Years of Education: N/A   Social History Main Topics  . Smoking status: Current Every Day Smoker -- 1.50 packs/day for 30 years    Types: Cigarettes  . Smokeless tobacco: Not on file  . Alcohol Use: Yes  . Drug Use: Yes    Special: Marijuana, Cocaine, Benzodiazepines  . Sexual Activity: Yes   Other Topics Concern  . None   Social History Narrative   Additional Social History:  She lives with her husband and 51 year old son. She was employed for 3 years of working for a woman with whom she now has conflict. In the past she worked as a Set designer.             Musculoskeletal: Strength & Muscle Tone: within normal limits Gait & Station: normal Patient leans: N/A  Psychiatric Specialty Exam: Physical Exam I reviewed the PE that was performed in the ER.   Review of Systems  Constitutional: Positive for weight loss and malaise/fatigue.  Neurological: Positive for headaches.    Blood pressure 107/75, pulse 91, temperature 98.3 F (36.8 C), temperature source Oral, resp. rate 20, height _0  (1.676 m), weight 44.453 kg (98 lb), last menstrual period 08/11/2014.Body mass index is 15.83 kg/(m^2).    SeeSRA.                                                 Sleep:  Number of Hours: 0   Risk to Self: Is patient at risk for suicide?: Yes Risk to Others:   Prior Inpatient Therapy:   Prior Outpatient Therapy:    Alcohol Screening:    Allergies:   Allergies  Allergen Reactions  . Codeine Sulfate Itching and Nausea Only   Lab Results:  Results for orders placed or performed during the hospital encounter of 08/11/14 (from the past 48 hour(s))  Acetaminophen level     Status: Abnormal   Collection Time: 08/11/14  9:10 AM  Result Value Ref Range   Acetaminophen (Tylenol), Serum <10 (L) 10 - 30 ug/mL  CBC     Status: Abnormal   Collection Time: 08/11/14  9:10 AM  Result Value Ref Range   WBC 16.1 (H) 3.6 - 11.0 K/uL   RBC 4.62 3.80 - 5.20 MIL/uL   Hemoglobin 13.2 12.0 - 16.0 g/dL   HCT 40.5 35.0 - 47.0 %   MCV 87.7 80.0 - 100.0 fL   MCH 28.6 26.0 - 34.0 pg   MCHC 32.6 32.0 - 36.0 g/dL   RDW 13.4 11.5 - 14.5 %   Platelets 455 (H) 150 - 440 K/uL  Comprehensive metabolic panel     Status: Abnormal   Collection Time: 08/11/14  9:10 AM  Result Value Ref Range   Sodium 139 135 - 145 mmol/L   Potassium 3.8 3.5 - 5.1 mmol/L   Chloride 107 101 - 111 mmol/L   CO2 24 22 - 32 mmol/L   Glucose, Bld 118 (H) 65 - 99 mg/dL   BUN 11 6 - 20 mg/dL   Creatinine, Ser 0.60 0.44 - 1.00 mg/dL   Calcium 8.8 (L) 8.9 - 10.3 mg/dL   Total Protein 7.7 6.5 - 8.1 g/dL   Albumin 4.3 3.5 - 5.0 g/dL   AST 16 15 - 41 U/L   ALT 10 (L) 14 - 54 U/L   Alkaline Phosphatase 51 38 - 126 U/L   Total Bilirubin 0.7 0.3 - 1.2 mg/dL   GFR calc non Af Amer >60 >60 mL/min   GFR calc Af Amer >60 >60 mL/min    Comment: (NOTE) The eGFR has been calculated using the CKD EPI equation. This calculation has not been validated in all clinical situations. eGFR's persistently <90 mL/min signify possible Chronic Kidney Disease.    Anion gap 8 5 - 15  Ethanol (ETOH)     Status: None   Collection Time: 08/11/14   9:10 AM  Result Value Ref Range   Alcohol, Ethyl (B) <5 <5 mg/dL  Salicylate level     Status: None   Collection Time: 08/11/14  9:10 AM  Result Value Ref Range   Salicylate Lvl <1.6 2.8 - 30.0 mg/dL  TSH  Status: None   Collection Time: 08/11/14  9:10 AM  Result Value Ref Range   TSH 2.145 0.350 - 4.500 uIU/mL  Pregnancy, urine     Status: None   Collection Time: 08/11/14  9:29 AM  Result Value Ref Range   Preg Test, Ur NEGATIVE NEGATIVE  Urinalysis complete, with microscopic     Status: Abnormal   Collection Time: 08/11/14  9:29 AM  Result Value Ref Range   Color, Urine STRAW (A) YELLOW   APPearance CLEAR (A) CLEAR   Glucose, UA NEGATIVE NEGATIVE mg/dL   Bilirubin Urine NEGATIVE NEGATIVE   Ketones, ur NEGATIVE NEGATIVE mg/dL   Specific Gravity, Urine 1.005 1.005 - 1.030   Hgb urine dipstick 2+ (A) NEGATIVE   pH 7.0 5.0 - 8.0   Protein, ur NEGATIVE NEGATIVE mg/dL   Nitrite NEGATIVE NEGATIVE   Leukocytes, UA NEGATIVE NEGATIVE   RBC / HPF 0-5 0 - 5 RBC/hpf   WBC, UA 0-5 0 - 5 WBC/hpf   Bacteria, UA NONE SEEN NONE SEEN   Squamous Epithelial / LPF 0-5 (A) NONE SEEN  Urine Drug Screen, Qualitative     Status: Abnormal   Collection Time: 08/11/14  9:29 AM  Result Value Ref Range   Tricyclic, Ur Screen NONE DETECTED NONE DETECTED   Amphetamines, Ur Screen NONE DETECTED NONE DETECTED   MDMA (Ecstasy)Ur Screen NONE DETECTED NONE DETECTED   Cocaine Metabolite,Ur [place] POSITIVE (A) NONE DETECTED   Opiate, Ur Screen NONE DETECTED NONE DETECTED   Phencyclidine (PCP) Ur S NONE DETECTED NONE DETECTED   Cannabinoid 50 Ng, Ur [place] POSITIVE (A) NONE DETECTED   Barbiturates, Ur Screen NONE DETECTED NONE DETECTED   Benzodiazepine, Ur Scrn NONE DETECTED NONE DETECTED   Methadone Scn, Ur NONE DETECTED NONE DETECTED    Comment: (NOTE) 916  Tricyclics, urine               Cutoff 1000 ng/mL 200  Amphetamines, urine             Cutoff 1000 ng/mL 300  MDMA (Ecstasy), urine           Cutoff 500  ng/mL 400  Cocaine Metabolite, urine       Cutoff 300 ng/mL 500  Opiate, urine                   Cutoff 300 ng/mL 600  Phencyclidine (PCP), urine      Cutoff 25 ng/mL 700  Cannabinoid, urine              Cutoff 50 ng/mL 800  Barbiturates, urine             Cutoff 200 ng/mL 900  Benzodiazepine, urine           Cutoff 200 ng/mL 1000 Methadone, urine                Cutoff 300 ng/mL 1100 1200 The urine drug screen provides only a preliminary, unconfirmed 1300 analytical test result and should not be used for non-medical 1400 purposes. Clinical consideration and professional judgment should 1500 be applied to any positive drug screen result due to possible 1600 interfering substances. A more specific alternate chemical method 1700 must be used in order to obtain a confirmed analytical result.  1800 Gas chromato graphy / mass spectrometry (GC/MS) is the preferred 1900 confirmatory method.    Current Medications: Current Facility-Administered Medications  Medication Dose Route Frequency Provider Last Rate Last Dose  . acetaminophen (TYLENOL) tablet 650  mg  650 mg Oral Q6H PRN Gonzella Lex, MD      . alum & mag hydroxide-simeth (MAALOX/MYLANTA) 200-200-20 MG/5ML suspension 30 mL  30 mL Oral Q4H PRN Gonzella Lex, MD      . feeding supplement (ENSURE ENLIVE) (ENSURE ENLIVE) liquid 237 mL  237 mL Oral BID BM Maylani Embree B Zein Helbing, MD   237 mL at 08/12/14 1242  . magnesium hydroxide (MILK OF MAGNESIA) suspension 30 mL  30 mL Oral Daily PRN Gonzella Lex, MD      . mirtazapine (REMERON SOL-TAB) disintegrating tablet 15 mg  15 mg Oral QHS Terek Bee B Antanasia Kaczynski, MD      . traZODone (DESYREL) tablet 50 mg  50 mg Oral QHS Citlaly Camplin B Joscelynn Brutus, MD       PTA Medications: No prescriptions prior to admission    Previous Psychotropic Medications: No.  Substance Abuse History in the last 12 months:  Yes.      Consequences of Substance Abuse: Negative  Results for orders placed or performed  during the hospital encounter of 08/11/14 (from the past 72 hour(s))  Acetaminophen level     Status: Abnormal   Collection Time: 08/11/14  9:10 AM  Result Value Ref Range   Acetaminophen (Tylenol), Serum <10 (L) 10 - 30 ug/mL  CBC     Status: Abnormal   Collection Time: 08/11/14  9:10 AM  Result Value Ref Range   WBC 16.1 (H) 3.6 - 11.0 K/uL   RBC 4.62 3.80 - 5.20 MIL/uL   Hemoglobin 13.2 12.0 - 16.0 g/dL   HCT 40.5 35.0 - 47.0 %   MCV 87.7 80.0 - 100.0 fL   MCH 28.6 26.0 - 34.0 pg   MCHC 32.6 32.0 - 36.0 g/dL   RDW 13.4 11.5 - 14.5 %   Platelets 455 (H) 150 - 440 K/uL  Comprehensive metabolic panel     Status: Abnormal   Collection Time: 08/11/14  9:10 AM  Result Value Ref Range   Sodium 139 135 - 145 mmol/L   Potassium 3.8 3.5 - 5.1 mmol/L   Chloride 107 101 - 111 mmol/L   CO2 24 22 - 32 mmol/L   Glucose, Bld 118 (H) 65 - 99 mg/dL   BUN 11 6 - 20 mg/dL   Creatinine, Ser 0.60 0.44 - 1.00 mg/dL   Calcium 8.8 (L) 8.9 - 10.3 mg/dL   Total Protein 7.7 6.5 - 8.1 g/dL   Albumin 4.3 3.5 - 5.0 g/dL   AST 16 15 - 41 U/L   ALT 10 (L) 14 - 54 U/L   Alkaline Phosphatase 51 38 - 126 U/L   Total Bilirubin 0.7 0.3 - 1.2 mg/dL   GFR calc non Af Amer >60 >60 mL/min   GFR calc Af Amer >60 >60 mL/min    Comment: (NOTE) The eGFR has been calculated using the CKD EPI equation. This calculation has not been validated in all clinical situations. eGFR's persistently <90 mL/min signify possible Chronic Kidney Disease.    Anion gap 8 5 - 15  Ethanol (ETOH)     Status: None   Collection Time: 08/11/14  9:10 AM  Result Value Ref Range   Alcohol, Ethyl (B) <5 <5 mg/dL  Salicylate level     Status: None   Collection Time: 08/11/14  9:10 AM  Result Value Ref Range   Salicylate Lvl <7.7 2.8 - 30.0 mg/dL  TSH     Status: None   Collection Time: 08/11/14  9:10  AM  Result Value Ref Range   TSH 2.145 0.350 - 4.500 uIU/mL  Pregnancy, urine     Status: None   Collection Time: 08/11/14  9:29 AM   Result Value Ref Range   Preg Test, Ur NEGATIVE NEGATIVE  Urinalysis complete, with microscopic     Status: Abnormal   Collection Time: 08/11/14  9:29 AM  Result Value Ref Range   Color, Urine STRAW (A) YELLOW   APPearance CLEAR (A) CLEAR   Glucose, UA NEGATIVE NEGATIVE mg/dL   Bilirubin Urine NEGATIVE NEGATIVE   Ketones, ur NEGATIVE NEGATIVE mg/dL   Specific Gravity, Urine 1.005 1.005 - 1.030   Hgb urine dipstick 2+ (A) NEGATIVE   pH 7.0 5.0 - 8.0   Protein, ur NEGATIVE NEGATIVE mg/dL   Nitrite NEGATIVE NEGATIVE   Leukocytes, UA NEGATIVE NEGATIVE   RBC / HPF 0-5 0 - 5 RBC/hpf   WBC, UA 0-5 0 - 5 WBC/hpf   Bacteria, UA NONE SEEN NONE SEEN   Squamous Epithelial / LPF 0-5 (A) NONE SEEN  Urine Drug Screen, Qualitative     Status: Abnormal   Collection Time: 08/11/14  9:29 AM  Result Value Ref Range   Tricyclic, Ur Screen NONE DETECTED NONE DETECTED   Amphetamines, Ur Screen NONE DETECTED NONE DETECTED   MDMA (Ecstasy)Ur Screen NONE DETECTED NONE DETECTED   Cocaine Metabolite,Ur [place] POSITIVE (A) NONE DETECTED   Opiate, Ur Screen NONE DETECTED NONE DETECTED   Phencyclidine (PCP) Ur S NONE DETECTED NONE DETECTED   Cannabinoid 50 Ng, Ur [place] POSITIVE (A) NONE DETECTED   Barbiturates, Ur Screen NONE DETECTED NONE DETECTED   Benzodiazepine, Ur Scrn NONE DETECTED NONE DETECTED   Methadone Scn, Ur NONE DETECTED NONE DETECTED    Comment: (NOTE) 732  Tricyclics, urine               Cutoff 1000 ng/mL 200  Amphetamines, urine             Cutoff 1000 ng/mL 300  MDMA (Ecstasy), urine           Cutoff 500 ng/mL 400  Cocaine Metabolite, urine       Cutoff 300 ng/mL 500  Opiate, urine                   Cutoff 300 ng/mL 600  Phencyclidine (PCP), urine      Cutoff 25 ng/mL 700  Cannabinoid, urine              Cutoff 50 ng/mL 800  Barbiturates, urine             Cutoff 200 ng/mL 900  Benzodiazepine, urine           Cutoff 200 ng/mL 1000 Methadone, urine                Cutoff 300 ng/mL 1100 1200  The urine drug screen provides only a preliminary, unconfirmed 1300 analytical test result and should not be used for non-medical 1400 purposes. Clinical consideration and professional judgment should 1500 be applied to any positive drug screen result due to possible 1600 interfering substances. A more specific alternate chemical method 1700 must be used in order to obtain a confirmed analytical result.  1800 Gas chromato graphy / mass spectrometry (GC/MS) is the preferred 1900 confirmatory method.     Observation Level/Precautions:  15 minute checks, suicide.  Laboratory:  CBC Chemistry Profile UDS UA  Psychotherapy:    Medications:  Remeron, Trazodone.  Consultations:  No.  Discharge Concerns: Home.  Estimated LOS: 2-3 days.  Other:     Psychological Evaluations: No.  Treatment Plan Summary: Daily contact with patient to assess and evaluate symptoms and progress in treatment and Medication management  Medical Decision Making:    Ms. Zwilling is a 49 year old female with no past psychiatric history admitted after a suicide attempt in the context of worsening of depression and severe social stressors.  1. Suicidal ideation. The patient is able to contract for safety in the hospital.  2.  Depression. We will start Remeron 15 mg at bedtime for depression and anxiety sleep and appetite..  3.  Insomnia. We will offer as needed trazodone 50 mg at bedtime.  4.  Smoking. Nicotine products are available but the patient refuses. She is not interested in smoking cessation.  5.  Substance abuse. The patient does not need alcohol detox. Vital signs are slight stable. She minimizes cocaine and marijuana use and is not interested in substance abuse treatment program participation.  6. Disposition. She will be discharged to home with family. She will follow up without a change for medication managementm, psychotherapy and substance abuse treatment.  I certify that inpatient services  furnished can reasonably be expected to improve the patient's condition.   Orson Slick 5/4/20161:27 PM

## 2014-08-12 NOTE — ED Notes (Signed)
Pt. Noted sleeping in room. No complaints or concerns voiced. No distress or abnormal behavior noted. Will continue to monitor with security cameras. Q 15 minute rounds continue. 

## 2014-08-12 NOTE — Progress Notes (Signed)
49 year old female admitted with Depression, SI, and Substance Abuse. Pt is calm and cooperative with admission process. Reports her reason for admission as "I got into some trouble, so in order not to put my family through the embarrassment, I slit my wrist." Currently denies SI. Denies avh. UDS positive Cocaine and Cannabis. Past medical history of skin cancer to left side of face. Pt oriented to unit and room. Will cont to monitor and provide support.

## 2014-08-12 NOTE — Progress Notes (Signed)
Recreation Therapy Notes  INPATIENT RECREATION THERAPY ASSESSMENT  Patient Details Name: PATTE WINKEL MRN: [id number] DOB: Nov 16, 1965 Today's Date: Aug 25, 2014  Patient Stressors: Death  Coping Skills:   Isolate, Substance Abuse, Avoidance, Exercise, Art/Dance, Music, Other (Comment) (watch TV, play with grandson, go outside)  Personal Challenges: Self-Esteem/Confidence  Leisure Interests (2+):  Individual - Other (Comment) (watch movies, spend time with grandson)  Awareness of Community Resources:  Yes  Community Resources:  Park  Current Use: Yes  If no, Barriers?:    Patient Strengths:  "Not right now"  Patient Identified Areas of Improvement:  "All of them...self-esteem"  Current Recreation Participation:  "I don't do anything." Take grandson to park  Patient Goal for Hospitalization:  To get better and go home  [place] of Residence:  [facility] of Residence:  [place]   Current [place] (including self-harm):  No  Current HI:  No  Consent to Intern Participation: N/A   Leonette Monarch, LRT/CTRS 08/25/2014, 5:54 PM

## 2014-08-12 NOTE — B[facility] Group Notes (Signed)
Adult Psychoeducational Group Note  Date:  08/12/2014 Time:  2:40 PM  Group Topic/Focus:  Emotional Education:   The focus of this group is to discuss what feelings/emotions are, and how they are experienced.  Participation Level:  Minimal  Participation Quality:  Attentive  Affect:  Anxious and Tearful  Cognitive:  Alert and Disorganized  Insight: Lacking  Engagement in Group:  Limited  Modes of Intervention:  Support  Additional Comments:  Pt was attentive during group discussion AEB her body language and eye contact and shared that her "48 year old grand-baby" brings joy to her life. Pt did not participate beyond questions asked directly from the facilitator.   Carmell Austria T 08/12/2014, 2:40 PM

## 2014-08-12 NOTE — BHH Group Notes (Signed)
[facility] LCSW Aftercare Discharge Planning Group Note  08/12/2014 11:19 AM  Participation Quality:  Attentive  Affect:  Anxious and Tearful  Cognitive:  Alert and Disorganized  Insight:  Lacking  Engagement in Group:  Limited  Modes of Intervention:  Exploration and Support  Summary of Progress/Problems: CSW shared with group BMU staff roles and explained LOS and discharge process to group members. Pt received a workbook for Wednesday on topic of "Personal Development". Pt shared that her SMART goal is to "get up, get a shower, get motivated, and talk to family". Pt was tearful at the end of group and anxious to meet with a social worker and get phone numbers to make phone calls to her family to bring clothes.    Carmell Austria T 08/12/2014, 11:19 AM

## 2014-08-12 NOTE — B[facility] Group Notes (Signed)
[facility] Group Notes:  (Nursing/MHT/Case Management/Adjunct)  Date:  08/12/2014  Time:  9:28 PM  Type of Therapy:  Group Therapy  Participation Level:  Active  Participation Quality:  Appropriate  Affect:  Appropriate  Cognitive:  Appropriate  Insight:  Good  Engagement in Group:  Engaged  Modes of Intervention:  Education  Summary of Progress/Problems:  Rinaldo Cloud 08/12/2014, 9:28 PM

## 2014-08-12 NOTE — B[facility] Group Notes (Signed)
[facility] Group Notes:  (Nursing/MHT/Case Management/Adjunct)  Date:  08/12/2014  Time:  1:00 PM  Type of Therapy:  Group Therapy  Participation Level:  Did Not Attend   Celso Amy 08/12/2014, 1:00 PM

## 2014-08-12 NOTE — Progress Notes (Signed)
Pt. Is cooperative.  Rates her depression as a 5.  Denies SI/HI, A/V hallucinations.  Pt. Has laceration to her left wrist, 4 sutures which are intact.  Clean bandage applied.

## 2014-08-12 NOTE — ED Notes (Signed)
Pt. Noted in room. No complaints or concerns voiced. No distress or abnormal behavior noted. Will continue to monitor with security cameras. Q 15 minute rounds continue. Dressing reapplied to left wrist.

## 2014-08-12 NOTE — ED Provider Notes (Signed)
-----------------------------------------   2:31 AM on 08/12/2014 -----------------------------------------   BP 123/71 mmHg  Pulse 72  Temp(Src) 98 F (36.7 C) (Oral)  Resp 20  Ht 5\' 6"  (1.676 m)  Wt 120 lb (54.432 kg)  BMI 19.38 kg/m2  SpO2 94%  The patient had no acute events today.  Calm and cooperative at this time.  Disposition is pending per Psychiatry/Behavioral Medicine team recommendations.     Loney Hering, MD 08/12/14 [phone]

## 2014-08-12 NOTE — ED Notes (Signed)
Dressing reapplied to left wrist. No drainage or redness noted.

## 2014-08-13 ENCOUNTER — Encounter: Payer: Self-pay | Admitting: *Deleted

## 2014-08-13 NOTE — Tx Team (Signed)
Interdisciplinary Treatment Plan Update (Adult)  Date:  08/13/2014 Time Reviewed:  9:11 AM  Progress in Treatment: Attending groups: No. Participating in groups:  No. Taking medication as prescribed:  No. Tolerating medication:  Yes. Family/Significant othe contact made:  No, Will Contact Patient understands diagnosis:  Yes. Discussing patient identified problems/goals with staff:  Yes. Medical problems stabilized or resolved:  Yes. Denies suicidal/homicidal ideation: No. Issues/concerns per patient self-inventory:  No. Other:  New problem(s) identified: No  Discharge Plan or Barriers: Discharge Home with follow up at [facility]  Reason for Continuation of Hospitalization: Depression Suicidal ideation Withdrawal symptoms  Comments:  Estimated length of stay: Up to 1 day  New goal(s):  Review of initial/current patient goals per problem list:  See Care Plan  Attendees: Patient:  Darlene Franco 5/5/20169:11 AM  Family:   5/5/20169:11 AM  Physician:  Orson Slick 5/5/20169:11 AM  Nursing:    5/5/20169:11 AM  Case Manager:   5/5/20169:11 AM  Counselor:  Dossie Arbour, LCSW 5/5/20169:11 AM  Other:  Carmell Austria, LCSWA 5/5/20169:11 AM  Other:  Enis Slipper, LCSW 5/5/20169:11 AM  Other:  [place], LCSW 5/5/20169:11 AM  Other:  5/5/20169:11 AM  Other:  5/5/20169:11 AM  Other:  5/5/20169:11 AM  Other:  5/5/20169:11 AM  Other:  5/5/20169:11 AM  Other:  5/5/20169:11 AM  Other:   5/5/20169:11 AM   Scribe for Treatment Team:   [name], 08/13/2014, 9:11 AM   Pt is newly admitted to [facility]. She has not yet been attending groups. She has recent stressor of her nephew completing Suicide and being the one to find him.  She will likely follow up with [facility]. SW will attempt family contact if Pt gives consent.  Pt's current medications are: acetaminophen (TYLENOL) tablet 650 mg  alum & mag hydroxide-simeth (MAALOX/MYLANTA)  200-200-20 MG/5ML suspension 30 mL  feeding supplement (ENSURE ENLIVE) (ENSURE ENLIVE) liquid 237 mL  magnesium hydroxide (MILK OF MAGNESIA) suspension 30 mL  mirtazapine (REMERON SOL-TAB) disintegrating tablet 15 mg  traZODone (DESYREL) tablet 50 mg  Dossie Arbour, LCSW, 08/13/2014, 11:26 am

## 2014-08-13 NOTE — [facility] Group Notes (Signed)
[facility] Group Notes:  (Nursing/MHT/Case Management/Adjunct)  Date:  08/13/2014  Time:  9:19 AM  Type of Therapy:  Community Meeting   Participation Level:  Did Not Attend  Participation Quality:  None  Affect:  None  Cognitive:  None  Insight:  None  Engagement in Group:  None  Modes of Intervention:  None  Summary of Progress/Problems:  Davon Abdelaziz De'Chelle Tramon Crescenzo 08/13/2014, 9:19 AM

## 2014-08-13 NOTE — Plan of Care (Signed)
Problem: Consults Goal: [facility] General Treatment Patient Education Outcome: Progressing Patient cooperative with treatment , and active in treatment plan.

## 2014-08-13 NOTE — Progress Notes (Signed)
Cadi pleasant on approach, she remains somewhat anxious although. She was visible in the milieu and spent most of the evening in the dayroom with peers. She cooperative with treatment although she still reports depression. She came to the med room and was compliant. She appears to be sleep at this time.

## 2014-08-13 NOTE — Progress Notes (Signed)
RD Assessment  Admitted with: depression PMHx: HTN, CHF, SOB, Bipolar, DM, Dementia, CKD,   Current Diet: Regular Typical Food/ Fluid Intake: 50-100% of meals recorded per I/O last 24 hrs Meal/ Snack Patterns: Unable to assess home diet  Supplements: Ensure Enlive BID  Food Allergies: NKFA Food Preferences: Reviewed  Ht: 66" Current weight: 98# BMI: 15.9 Weight Changes: Unable to assess; per MST, patient reports a 2-13# weight loss over undetermined length of time IBW: 61.4 kg  UOP: Reviewed Digestive: Reviewed Gastrointestinal: Reviewed Skin: Reviewed, no concerns Physical Findings: REviewed  Labs: Protein Profile:  Recent Labs Lab 08/11/14 0910  ALBUMIN 4.3   Calories: 1720-2064 kcal/ day (BEE: 1433 x 1.2 AF x 1.0-1.2 IF)- using IBW Protein: 74-86 g Pro/ day (1.2-1.4 g pro/ day)- using IBW Fluid: 1535-1842 (25-30 ml/ kg)- using IBW  Meds: Remeron  PES Statement: Inadequate energy intake related to chronic illness as evidenced by underweight per BMI, weight loss per MST of 2-13#  Intervention: Meals and Snacks: Cater to patient preferences Continue Ensure Enlive BID for additional nutrition  Monitoring/ Evaluation: Energy Intake: goal for patient to meet >90% of estimated needs. Anthropometrics  Moderate Care Level

## 2014-08-13 NOTE — BHH Group Notes (Signed)
[place] Group Notes:  (Nursing/MHT/Case Management/Adjunct)  Date:  08/13/2014  Time:  11:57 AM  Type of Therapy:  Group Therapy  Participation Level:  Did Not Attend   Celso Amy 08/13/2014, 11:57 AM

## 2014-08-13 NOTE — Progress Notes (Signed)
Patient is alert and oriented x4.  Patient has a restricted affect that brightens upon approach.  Patient denies suicidal thoughts or homicidal thoughts when asked. Patient was medication compliant but did not attend groups this morning.   Patient's Daughter Nira Conn) called and stated that she is afraid that her mother is still suicidal.  Patient has lac on Left wrist with sutures in place.  A clean bandage was applied today.  Will cont to monitor for safety.

## 2014-08-13 NOTE — Plan of Care (Signed)
Problem: Consults Goal: Depression Patient Education See Patient Education Module for education specifics.  Outcome: Progressing Patient is pleasant on approach,she reports that depression is improving and she is looking forward to discharge.

## 2014-08-13 NOTE — Progress Notes (Signed)
Recreation Therapy Notes  Date: 05.05.16 Time: 3:00 pm Location: Craft Room  Group Topic: Leisure Education  Goal Area(s) Addresses:  Increase leisure awareness.  Behavioral Response: Did not attend  Intervention: Leisure Time  Activity: Patients were instructed to write two healthy leisure activities on slips of paper. Patients were then given a Leisure Time Clock worksheet and instructed to fill out the worksheet. Patients were instructed to list 10 positive emotions. Patient matched their leisure activities to the positive emotions.  Education:Patient did not attend group.  Education Outcome: Patient did not attend group.  Clinical Observations/Feedback: Patient did not attend group.   Leonette Monarch, LRT/CTRS 08/13/2014 5:18 PM

## 2014-08-13 NOTE — Progress Notes (Signed)
[facility] MD Progress Note  08/13/2014 8:48 PM MAYU RONK  MRN:  [id number]  Subjective: Ms. Liming was very sleepy from Trazodone his am. She feels better. Mood is 6/10, affect brighter. She is not suicidal or homicidal. She tolerates medications well. Good group participation.  Principal Problem: Depression, major, single episode, severe Diagnosis:   Patient Active Problem List   Diagnosis Date Noted  . 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 Depression, major, single episode, severe [F32.2] 08/11/2014  . Cocaine use [F14.10] 08/11/2014  . Marijuana abuse [F12.10] 08/11/2014  . Pain, dental [K08.8] 08/11/2014  . Dysmenorrhea [N94.6] 08/11/2014   Total Time spent with patient: 20 minutes   Past Medical History:  Past Medical History  Diagnosis Date  . Depression   . Cancer     Past Surgical History  Procedure Laterality Date  . Fracture surgery     Family History:  Family History  Problem Relation Age of Onset  . Suicidality Cousin   . Suicidality Other    Social History:  History  Alcohol Use  . Yes     History  Drug Use  . Yes  . Special: Marijuana, Cocaine, Benzodiazepines    History   Social History  . Marital Status: Married    Spouse Name: N/A  . Number of Children: N/A  . Years of Education: N/A   Social History Main Topics  . Smoking status: Current Every Day Smoker -- 1.50 packs/day for 30 years    Types: Cigarettes  . Smokeless tobacco: Not on file  . Alcohol Use: Yes  . Drug Use: Yes    Special: Marijuana, Cocaine, Benzodiazepines  . Sexual Activity: Yes   Other Topics Concern  . None   Social History Narrative   Additional History:    Sleep: Good  Appetite:  Good   Assessment:   Musculoskeletal: Strength & Muscle Tone: within normal limits Gait & Station: normal Patient leans: N/A   Psychiatric Specialty Exam: Physical Exam  Review of Systems  All other systems reviewed  and are negative.   Blood pressure 109/78, pulse 87, temperature 98.7 F (37.1 C), temperature source Oral, resp. rate 20, height 5\' 6"  (1.676 m), weight 44.453 kg (98 lb), last menstrual period 08/11/2014.Body mass index is 15.83 kg/(m^2).  General Appearance: Casual  Eye Contact::  Good  Speech:  Normal Rate  Volume:  Normal  Mood:  Depressed  Affect:  Congruent  Thought Process:  Coherent  Orientation:  Full (Time, Place, and Person)  Thought Content:  WDL  Suicidal Thoughts:  No  Homicidal Thoughts:  No  Memory:  Immediate;   Fair Recent;   Fair Remote;   Fair  Judgement:  Fair  Insight:  Fair  Psychomotor Activity:  Normal  Concentration:  Fair  Recall:  [employer] [place]  Language: Fair  Akathisia:  No  Handed:  Right  AIMS (if indicated):     Assets:  Communication Skills Desire for Improvement Housing Physical Health Social Support  ADL's:  Intact  Cognition: WNL  Sleep:  Number of Hours: 6.5     Current Medications: Current Facility-Administered Medications  Medication Dose Route Frequency Provider Last Rate Last Dose  . acetaminophen (TYLENOL) tablet 650 mg  650 mg Oral Q6H PRN Gonzella Lex, MD      . alum & mag hydroxide-simeth (MAALOX/MYLANTA) 200-200-20 MG/5ML suspension 30 mL  30 mL Oral Q4H PRN Gonzella Lex, MD      .  feeding supplement (ENSURE ENLIVE) (ENSURE ENLIVE) liquid 237 mL  237 mL Oral BID BM Rj Pedrosa B Vinod Mikesell, MD   237 mL at 08/13/14 1727  . magnesium hydroxide (MILK OF MAGNESIA) suspension 30 mL  30 mL Oral Daily PRN Gonzella Lex, MD      . mirtazapine (REMERON SOL-TAB) disintegrating tablet 15 mg  15 mg Oral QHS Clovis Fredrickson, MD   15 mg at 08/12/14 2139    Lab Results: No results found for this or any previous visit (from the past 48 hour(s)).  Physical Findings: AIMS: Facial and Oral Movements Muscles of Facial Expression: None, normal Lips and Perioral Area: None, normal Jaw: None, normal Tongue: None,  normal,Extremity Movements Upper (arms, wrists, hands, fingers): None, normal Lower (legs, knees, ankles, toes): None, normal, Trunk Movements Neck, shoulders, hips: None, normal, Overall Severity Severity of abnormal movements (highest score from questions above): None, normal Incapacitation due to abnormal movements: None, normal Patient's awareness of abnormal movements (rate only patient's report): No Awareness, Dental Status Current problems with teeth and/or dentures?: Yes Does patient usually wear dentures?: Yes  CIWA:    COWS:     Treatment Plan Summary: Daily contact with patient to assess and evaluate symptoms and progress in treatment and Medication management   Medical Decision Making:  New problem, with additional work up planned, Review of Psycho-Social Stressors (1), Review of Medication Regimen & Side Effects (2) and Review of New Medication or Change in Dosage (2)  Ms. Dinh is a 49 year old female with no past psychiatric history admitted after a suicide attempt in the context of worsening of depression and severe social stressors.  1. Suicidal ideation. The patient is able to contract for safety in the hospital.  2. Depression. We will start Remeron 15 mg at bedtime for depression and anxiety sleep and appetite.  3. Insomnia. We will offer as needed trazodone 50 mg at bedtime.  4. Smoking. Nicotine products are available but the patient refuses. She is not interested in smoking cessation.  5. Substance abuse. The patient does not need alcohol detox. Vital signs are slight stable. She minimizes cocaine and marijuana use and is not interested in substance abuse treatment program participation.  6. Disposition. She will be discharged to home with family. She will follow up without a change for medication managementm, psychotherapy and substance abuse treatment.   Orson Slick 08/13/2014, 8:48 PM

## 2014-08-13 NOTE — Plan of Care (Signed)
Problem: [facility] Participation in Recreation Therapeutic Interventions Goal: STG-Patient will demonstrate improved self esteem by identif STG: Self-Esteem - Within 3 treatment sessions, patient will verbalize at least 5 positive affirmation statements in each of 2 treatment sessions to increase self-esteem post d/c.  Outcome: Progressing Treatment Session 1; Completed 1 out of 2: At approximately 11:50 am, LRT met with patient in patient room. Patient verbalized 5 positive affirmation statements. Patient reported it felt "pretty good". LRT encouraged patient to continue saying positive affirmation statements.  Leonette Monarch, LRT/CTRS 05.05.16 1:36 pm

## 2014-08-14 DIAGNOSIS — F322 Major depressive disorder, single episode, severe without psychotic features: Secondary | ICD-10-CM | POA: Insufficient documentation

## 2014-08-14 MED ORDER — MIRTAZAPINE 15 MG PO TBDP: 15.0000 mg | ORAL_TABLET | Freq: Every day | ORAL | Status: AC

## 2014-08-14 MED ORDER — PNEUMOCOCCAL VAC POLYVALENT 25 MCG/0.5ML IJ INJ: 0.5000 mL | INJECTION | INTRAMUSCULAR | Status: DC

## 2014-08-14 NOTE — Progress Notes (Signed)
  [facility] Adult Case Management Discharge Plan :  Will you be returning to the same living situation after discharge:  Yes,  Home with her Husband At discharge, do you have transportation home?: Yes,  Sister Darlene Starcher Do you have the ability to pay for your medications: Yes,  [employer]  Release of information consent forms completed and in the chart;  Patient's signature needed at discharge.  Patient to Follow up at: Follow-up Information    Follow up with [facility]. Go on 08/17/2014.   Why:  Hospital Follow up, referral for SAIOP,  Walk in between 9am-4pm.  Appointments are First come First Serve, please take your insurance card and ID.   Contact information:   Darlene Franco [id number] [phone]; [phone]      Patient denies SI/HI: Yes,       Safety Planning and Suicide Prevention discussed: Yes,  with Pt and her sister Darlene Franco  Have you used any form of tobacco in the last 30 days? (Cigarettes, Smokeless Tobacco, Cigars, and/or Pipes): Yes  Has patient been referred to the Quitline?: Patient refused referral  Darlene Franco 08/14/2014, 3:13 PM

## 2014-08-14 NOTE — Progress Notes (Signed)
Patient ID: Darlene Franco, female   DOB: [birth year], 49 y.o.   MRN: [id number] Met with Pt to discuss her uncertainty about being ready for discharge. She was tearful at one point in talking about her fears as she faces potential jail time for stealing some jewelry.  She admits that she did this, but also says that she is being charged with having stolen a gun as well which she refuses having done.  She denies any thoughts of hurting herself and denies having a weapon. She did say however, that she doesn't really want [place] to talk with her husband.  She was tearful saying things like her family "just sees me like a Fuck up"  She says she feels like they think she is a bad person. SW provided some education about addiction and it's nature and encouraged her to invest this time that she doesn't have to be working into caring for herself by getting involved in [place] and NA/AA groups. She agreed to give treatment a try.  SW reviewed with her how her use can make her harming herself at a higher risk.  She verbalized understanding of crisis plan and that she can call her sister or other professionals at [employer] or Temporary sponsor once given one.  She requests discharge. SW notified Dr. Bary Leriche who confirms she may be discharged today. Dossie Arbour, MSW, LCSW 08/14/2014 3:23 PM

## 2014-08-14 NOTE — Progress Notes (Signed)
Recreation Therapy Notes  INPATIENT RECREATION TR PLAN  Patient Details Name: Darlene Franco MRN: [id number] DOB: 08/13/1965 Today's Date: 08/14/2014  Rec Therapy Plan Is patient appropriate for Therapeutic Recreation?: Yes Treatment times per week: At least 3 times a week TR Treatment/Interventions: 1:1 session, Group participation (Comment) (Appropriate participate in daily recreation therapy tx)  Discharge Criteria Pt will be discharged from therapy if:: Discharged Treatment plan/goals/alternatives discussed and agreed upon by:: Patient/family  Discharge Summary Short term goals set: Within 3 treatment sessions, patient will verbalize at least 5 positive affirmation statements in each of 2 treatment sessions to increase self-esteem post d/c. (Completed 2 out of 2) Short term goals met: Complete Progress toward goals comments: One-to-one attended One-to-one attended: Self-esteem Reason goals not met: N/A Therapeutic equipment acquired: None Reason patient discharged from therapy: Treatment goals met Pt/family agrees with progress & goals achieved: Yes Date patient discharged from therapy: 08/14/14   Leonette Monarch, LRT/CTRS 08/14/2014, 12:07 PM

## 2014-08-14 NOTE — Progress Notes (Signed)
Darlene Franco is alert and oriented x4.   She is cooperative at this time. She rates her depression as a 5/10.  Denies SI/HI, A/V hallucinations. Patient states she is not sure if she is ready for discharge today or if she needs to wait a few more days.  Pt. Has laceration to her left wrist, 4 sutures which are intact.  Clean bandage applied. Will continue to monitor q 18mins for safety.

## 2014-08-14 NOTE — BHH Group Notes (Signed)
[facility] LCSW Aftercare Discharge Planning Group Note  08/14/2014 11:44 AM  Participation Quality:  did not attend  Affect:  n/a  Cognitive:  n/a  Insight:  n/a  Engagement in Group:  n/a  Modes of Intervention:  n/a  Summary of Progress/Problems: Pt did not attend group.  Carmell Austria T 08/14/2014, 11:44 AM

## 2014-08-14 NOTE — BHH Suicide Risk Assessment (Signed)
[facility] Discharge Suicide Risk Assessment   Demographic Factors:  Caucasian and Unemployed  Total Time spent with patient: 30 minutes  Musculoskeletal: Strength & Muscle Tone: within normal limits Gait & Station: normal Patient leans: N/A  Psychiatric Specialty Exam: Physical Exam  Nursing note and vitals reviewed.   Review of Systems  All other systems reviewed and are negative.   Blood pressure 105/66, pulse 79, temperature 98.5 F (36.9 C), temperature source Oral, resp. rate 20, height 5\' 6"  (1.676 m), weight 44.453 kg (98 lb), last menstrual period 08/11/2014.Body mass index is 15.83 kg/(m^2).  General Appearance: Casual  Eye Contact::  Good  Speech:  Clear and [id number]  Volume:  Normal  Mood:  Euthymic  Affect:  Appropriate and Congruent  Thought Process:  Coherent and Goal Directed  Orientation:  Full (Time, Place, and Person)  Thought Content:  WDL  Suicidal Thoughts:  No  Homicidal Thoughts:  No  Memory:  Immediate;   Fair Recent;   Fair Remote;   Fair  Judgement:  Fair  Insight:  Fair  Psychomotor Activity:  Normal  Concentration:  Fair  Recall:  [employer] [place]  Language: Fair  Akathisia:  No  Handed:  Right  AIMS (if indicated):     Assets:  Communication Skills Desire for Improvement Financial Resources/Insurance Housing Physical Health Social Support  Sleep:  Number of Hours: 6.25  Cognition: WNL  ADL's:  Intact   Have you used any form of tobacco in the last 30 days? (Cigarettes, Smokeless Tobacco, Cigars, and/or Pipes): Yes  Has this patient used any form of tobacco in the last 30 days? (Cigarettes, Smokeless Tobacco, Cigars, and/or Pipes) Yes, A prescription for an FDA-approved tobacco cessation medication was offered at discharge and the patient refused  Mental Status Per Nursing Assessment::   On Admission:     Current Mental Status by Physician: NA  Loss Factors: Decrease in vocational status  Historical  Factors: NA  Risk Reduction Factors:   Responsible for children under 72 years of age, Sense of responsibility to family, Living with another person, especially a relative and Positive social support  Continued Clinical Symptoms:  Depression:   Impulsivity Alcohol/Substance Abuse/Dependencies  Cognitive Features That Contribute To Risk:  None    Suicide Risk:  Minimal: No identifiable suicidal ideation.  Patients presenting with no risk factors but with morbid ruminations; may be classified as minimal risk based on the severity of the depressive symptoms  Principal Problem: Depression, major, single episode, severe Discharge Diagnoses:  Patient Active Problem List   Diagnosis Date Noted  . 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 Depression, major, single episode, severe [F32.2] 08/11/2014  . Cocaine use [F14.10] 08/11/2014  . Marijuana abuse [F12.10] 08/11/2014  . Pain, dental [K08.8] 08/11/2014  . Dysmenorrhea [N94.6] 08/11/2014      Plan Of Care/Follow-up recommendations:  Activity:  as tolerated Diet:  low sodium heart healthy Other:  keep follow up appointments  Is patient on multiple antipsychotic therapies at discharge:  No   Has Patient had three or more failed trials of antipsychotic monotherapy by history:  No  Recommended Plan for Multiple Antipsychotic Therapies: NA    Talita Recht 08/14/2014, 7:53 AM

## 2014-08-14 NOTE — Progress Notes (Signed)
Recreation Therapy Notes  Date: 05.06.16 Time: 3:00 pm Location: Craft Room  Group Topic: Communication, Problem solving, and Teamwork  Goal Area(s) Addresses:  Patient will effectively work with peer towards shared goal. Patient will identify skill used to make activity successful. Patient will identify how skills used during activity can be used to reach post d/c goals.  Behavioral Response: Attentive, Interactive  Intervention: Life Boat  Activity: Patients were given a list of 16 people (President Obama, [employer], pregnant woman, etc.). Patients were instructed to pick 8 people to go on a nicer life boat with the group and the other 8 would go on an inflatable boat.  Education:LRT educated patient on healthy support systems and why they are important.  Education Outcome: Acknowledges education/In group clarification offered  Clinical Observations/Feedback: Patient used effective communication, problem solving, and teamwork in group. Patient raised her hand for people she wanted Patient listened to peers. Patient did not contribute to group discussion.   Leonette Monarch, LRT/CTRS 08/14/2014 4:44 PM

## 2014-08-14 NOTE — BHH Group Notes (Signed)
[place] Group Notes:  (Nursing/MHT/Case Management/Adjunct)  Date:  08/14/2014  Time:  2:25 PM  Type of Therapy:  Group Therapy  Participation Level:  Did Not Attend  Summary of Progress/Problems:  Darlene Franco Darlene Franco 08/14/2014, 2:25 PM

## 2014-08-14 NOTE — Progress Notes (Signed)
Patient being discharged today.  Patient denies any SI/HI/AVH at this time.  Discharge instructions reviewed and prescriptions given. Patient transport home via family.

## 2014-08-14 NOTE — BHH Group Notes (Signed)
[facility] LCSW Group Therapy  08/14/2014 10:50 AM  Type of Therapy:  Psychoeducational Skills  Participation Level:  Minimal  Participation Quality:  Attentive and pt states she did not want to share but wanted to hear what others were saying during group   Affect:  Appropriate and Depressed  Cognitive:  Alert, Appropriate and Oriented  Insight:  Improving  Engagement in Therapy:  Lacking  Modes of Intervention:  Discussion, Socialization and Support  Summary of Progress/Problems: Pt attended group and shared only when asked questions by the clinician. Pt was attentive but refused to share and stated that she only wanted to hear what others had to share but did state she could related to issues that others were struggling with related to Balance in Life.  Carmell Austria T 08/14/2014, 10:50 AM

## 2014-08-14 NOTE — BHH Suicide Risk Assessment (Signed)
[place] INPATIENT:  Family/Significant Other Suicide Prevention Education  Suicide Prevention Education:  Education Completed; Humberto Leep, Pt's sister,  (name of family member/significant other) has been identified by the patient as the family member/significant other with whom the patient will be residing, and identified as the person(s) who will aid the patient in the event of a mental health crisis (suicidal ideations/suicide attempt).  With written consent from the patient, the family member/significant other has been provided the following suicide prevention education, prior to the and/or following the discharge of the patient.  The suicide prevention education provided includes the following:  Suicide risk factors  Suicide prevention and interventions  National Suicide Hotline telephone number  [facility] assessment telephone number  [facility] Emergency Assistance [place] and/or Residential Mobile Crisis Unit telephone number  Request made of family/significant other to:  Remove weapons (e.g., guns, rifles, knives), all items previously/currently identified as safety concern.    Remove drugs/medications (over-the-counter, prescriptions, illicit drugs), all items previously/currently identified as a safety concern.  The family member/significant other verbalizes understanding of the suicide prevention education information provided.  The family member/significant other agrees to remove the items of safety concern listed above.  [name] 08/14/2014, 8:41 AM

## 2014-08-14 NOTE — Plan of Care (Signed)
Problem: [facility] Participation in Recreation Therapeutic Interventions Goal: STG-Patient will demonstrate improved self esteem by identif STG: Self-Esteem - Within 3 treatment sessions, patient will verbalize at least 5 positive affirmation statements in each of 2 treatment sessions to increase self-esteem post d/c.  Outcome: Completed/Met Date Met:  08/14/14 Treatment session 2; Completed 2 out of 2: At approximately 11:20 am, LRT met with patient in patient room. Patient verbalized 5 positive affirmation statements. Patient reported it felt "okay". Patient reported she felt a "little bit better" about herself. LRT encouraged patient to continue saying positive affirmation statements.  Leonette Monarch, LRT/CTRS 05.06.16 12:05 pm

## 2014-08-14 NOTE — Progress Notes (Signed)
Darlene Franco was pleasant on approach, she was visible in the milieu, she interacted well with peers and staff. She reports depression and mood is improving. She talked to Probation officer about possible discharge tomorrow.  She appears to be sleep resting in bed at this time.

## 2014-08-14 NOTE — BHH Group Notes (Signed)
Adult Psychoeducational Group Note  Date:  08/14/2014 Time:  2:38 PM  Group Topic/Focus:  Relapse Prevention Planning:   The focus of this group is to define relapse and discuss the need for planning to combat relapse.  Participation Level:  Did Not Attend  Participation Quality:  n/a  Affect:  n/a  Cognitive:  n/a  Insight: n/a  Engagement in Group:  n/a  Modes of Intervention:  n/a  Additional Comments:  Pt did not attend group.  Carmell Austria T 08/14/2014, 2:38 PM

## 2014-08-14 NOTE — Progress Notes (Signed)
   08/14/14 1300  Clinical Encounter Type  Visited With Patient  Visit Type Behavioral Health;Other (Comment) (Advance Directive)  Referral From Nurse  Consult/Referral To Chaplain  Spiritual Encounters  Spiritual Needs (Advance Directive)  Stress Factors  Patient Stress Factors None identified  Family Stress Factors None identified  Advance Directives (For Healthcare)  Does patient have an advance directive? No  Would patient like information on creating an advanced directive? Yes - Scientist, clinical (histocompatibility and immunogenetics) given   Patient requested info on Forensic scientist. Chaplain provided education on Forensic scientist.  AD. [phone]

## 2014-08-14 NOTE — Discharge Summary (Addendum)
Physician Discharge Summary Note  Patient:  Darlene Franco is an 49 y.o., female MRN:  [id number] DOB:  [birth year] Patient phone:  [phone] (home)  Patient address:   [place] [address],  Total Time spent with patient: 30 minutes  Date of Admission:  08/12/2014 Date of Discharge: 08/14/2014  Reason for Admission:  Suicide attempt.  Principal Problem: Depression, major, single episode, severe Discharge Diagnoses: Patient Active Problem List   Diagnosis Date Noted  . 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 Depression, major, single episode, severe [F32.2] 08/11/2014  . Cocaine use [F14.10] 08/11/2014  . Marijuana abuse [F12.10] 08/11/2014  . Pain, dental [K08.8] 08/11/2014  . Dysmenorrhea [N94.6] 08/11/2014    Musculoskeletal: Strength & Muscle Tone: within normal limits Gait & Station: normal Patient leans: N/A  Psychiatric Specialty Exam: Physical Exam  Nursing note and vitals reviewed.   Review of Systems  All other systems reviewed and are negative.   Blood pressure 105/66, pulse 79, temperature 98.5 F (36.9 C), temperature source Oral, resp. rate 20, height 5\' 6"  (1.676 m), weight 44.453 kg (98 lb), last menstrual period 08/11/2014.Body mass index is 15.83 kg/(m^2).    See SRA                                                Sleep:  Number of Hours: 6.25   Have you used any form of tobacco in the last 30 days? (Cigarettes, Smokeless Tobacco, Cigars, and/or Pipes): Yes  Has this patient used any form of tobacco in the last 30 days? (Cigarettes, Smokeless Tobacco, Cigars, and/or Pipes) Yes, A prescription for an FDA-approved tobacco cessation medication was offered at discharge and the patient refused  Past Medical History:  Past Medical History  Diagnosis Date  . Depression   . Cancer     Past Surgical History  Procedure Laterality Date  . Fracture surgery      Family History:  Family History  Problem Relation Age of Onset  . Suicidality Cousin   . Suicidality Other    Social History:  History  Alcohol Use  . Yes     History  Drug Use  . Yes  . Special: Marijuana, Cocaine, Benzodiazepines    History   Social History  . Marital Status: Married    Spouse Name: N/A  . Number of Children: N/A  . Years of Education: N/A   Social History Main Topics  . Smoking status: Current Every Day Smoker -- 1.50 packs/day for 30 years    Types: Cigarettes  . Smokeless tobacco: Not on file  . Alcohol Use: Yes  . Drug Use: Yes    Special: Marijuana, Cocaine, Benzodiazepines  . Sexual Activity: Yes   Other Topics Concern  . None   Social History Narrative    Past Psychiatric History: Hospitalizations:  Outpatient Care:  Substance Abuse Care:  Self-Mutilation:  Suicidal Attempts:  Violent Behaviors:   Risk to Self: Is patient at risk for suicide?: Yes Risk to Others:   Prior Inpatient Therapy:   Prior Outpatient Therapy:    Level of Care:  OP  Hospital Course:   [name] is a 49 year old female with no past psychiatric history admitted after a suicide attempt in the context of worsening of depression and severe social stressors.  1. Suicidal ideation.This  has resolved.  The patient is able to contract for safety.  2. Depression. We started Remeron 15 mg at bedtime for depression and anxiety sleep and appetite.  3. Insomnia. We will offer as needed trazodone 50 mg at bedtime.  4. Smoking. Nicotine products are available but the patient refuses. She is not interested in smoking cessation.  5. Substance abuse. The patient does not need alcohol detox. Vital signs are slight stable. She minimizes cocaine and marijuana use and is not interested in substance abuse treatment program participation.  6. Social. Today her family disclosed that the patient has arrest warrant and will go to jail following discharge. Her charges  were related to theft that reportedly included a gun. The patient adamantly denies possession of a gun. SW spoke extensively with her sister. In spite of legal problems, the patient is not suicidal or homicidal. She did not want family meeting or any contact with her husband by SW.  7. Disposition. She will be discharged to home with family. She will follow up with [facility] for medication managementm, psychotherapy and substance abuse treatment.   Consults:  None  Significant Diagnostic Studies:  None  Discharge Vitals:   Blood pressure 105/66, pulse 79, temperature 98.5 F (36.9 C), temperature source Oral, resp. rate 20, height 5\' 6"  (1.676 m), weight 44.453 kg (98 lb), last menstrual period 08/11/2014. Body mass index is 15.83 kg/(m^2). Lab Results:   No results found for this or any previous visit (from the past 72 hour(s)).  Physical Findings: AIMS: Facial and Oral Movements Muscles of Facial Expression: None, normal Lips and Perioral Area: None, normal Jaw: None, normal Tongue: None, normal,Extremity Movements Upper (arms, wrists, hands, fingers): None, normal Lower (legs, knees, ankles, toes): None, normal, Trunk Movements Neck, shoulders, hips: None, normal, Overall Severity Severity of abnormal movements (highest score from questions above): None, normal Incapacitation due to abnormal movements: None, normal Patient's awareness of abnormal movements (rate only patient's report): No Awareness, Dental Status Current problems with teeth and/or dentures?: Yes Does patient usually wear dentures?: Yes  CIWA:    COWS:      See Psychiatric Specialty Exam and Suicide Risk Assessment completed by Attending Physician prior to discharge.  Discharge destination:  Home  Is patient on multiple antipsychotic therapies at discharge:  No   Has Patient had three or more failed trials of antipsychotic monotherapy by history:  No    Recommended Plan for Multiple Antipsychotic  Therapies: NA  Discharge Instructions    Diet - low sodium heart healthy    Complete by:  As directed      Increase activity slowly    Complete by:  As directed             Medication List    TAKE these medications      Indication   mirtazapine 15 MG disintegrating tablet  Commonly known as:  REMERON SOL-TAB  Take 1 tablet (15 mg total) by mouth at bedtime.   Indication:  Trouble Sleeping, Major Depressive Disorder           Follow-up Information    Follow up with [facility]. Go on 08/17/2014.   Why:  Hospital Follow up, referral for SAIOP,  Walk in between 9am-4pm.  Appointments are First come First Serve, please take your insurance card and ID.   Contact information:   [place] [address] [phone]; [phone]      Follow-up recommendations:  Activity:  as tolerated Diet:  regular  Other:  keep follow up appointments.  Comments:   Total Discharge Time: 24  Signed: Orson Slick 08/14/2014, 9:45 PM

## 2016-12-05 ENCOUNTER — Emergency Department
Admission: EM | Admit: 2016-12-05 | Discharge: 2016-12-05 | Disposition: A | Discharge status: Home / Self Care | Payer: Managed Care, Other (non HMO) | Attending: Emergency Medicine | Admitting: Emergency Medicine

## 2016-12-05 ENCOUNTER — Encounter: Payer: Self-pay | Admitting: Emergency Medicine

## 2016-12-05 ENCOUNTER — Emergency Department: Payer: Managed Care, Other (non HMO)

## 2016-12-05 DIAGNOSIS — N12 Tubulo-interstitial nephritis, not specified as acute or chronic: Secondary | ICD-10-CM | POA: Diagnosis not present

## 2016-12-05 DIAGNOSIS — R1032 Left lower quadrant pain: Secondary | ICD-10-CM | POA: Diagnosis present

## 2016-12-05 DIAGNOSIS — F17210 Nicotine dependence, cigarettes, uncomplicated: Secondary | ICD-10-CM | POA: Diagnosis not present

## 2016-12-05 HISTORY — DX: Calculus of kidney: N20.0

## 2016-12-05 LAB — COMPREHENSIVE METABOLIC PANEL
ALT: 13 U/L — ABNORMAL LOW (ref 14–54)
AST: 16 U/L (ref 15–41)
Albumin: 4.1 g/dL (ref 3.5–5.0)
Alkaline Phosphatase: 79 U/L (ref 38–126)
Anion gap: 11 (ref 5–15)
BUN: 13 mg/dL (ref 6–20)
CO2: 23 mmol/L (ref 22–32)
Calcium: 9.4 mg/dL (ref 8.9–10.3)
Chloride: 104 mmol/L (ref 101–111)
Creatinine, Ser: 0.81 mg/dL (ref 0.44–1.00)
GFR calc Af Amer: >60 mL/min (ref >60)
GFR calc non Af Amer: >60 mL/min (ref >60)
Glucose, Bld: 128 mg/dL — ABNORMAL HIGH (ref 65–99)
Potassium: 3.8 mmol/L (ref 3.5–5.1)
SODIUM: 138 mmol/L (ref 135–145)
Total Bilirubin: 0.5 mg/dL (ref 0.3–1.2)
Total Protein: 7.8 g/dL (ref 6.5–8.1)

## 2016-12-05 LAB — CBC WITH DIFFERENTIAL/PLATELET
Basophils Absolute: 0.1 10*3/uL (ref 0–0.1)
Basophils Relative: 1 %
Eosinophils Absolute: 0 10*3/uL (ref 0–0.7)
Eosinophils Relative: 0 %
HCT: 42.8 % (ref 35.0–47.0)
Hemoglobin: 15 g/dL (ref 12.0–16.0)
LYMPHS PCT: 17 %
Lymphs Abs: 2.6 10*3/uL (ref 1.0–3.6)
MCH: 29.4 pg (ref 26.0–34.0)
MCHC: 35 g/dL (ref 32.0–36.0)
MCV: 83.8 fL (ref 80.0–100.0)
Monocytes Absolute: 1.5 10*3/uL — ABNORMAL HIGH (ref 0.2–0.9)
Monocytes Relative: 10 %
Neutro Abs: 10.8 10*3/uL — ABNORMAL HIGH (ref 1.4–6.5)
Neutrophils Relative %: 72 %
Platelets: 366 10*3/uL (ref 150–440)
RBC: 5.11 MIL/uL (ref 3.80–5.20)
RDW: 12.8 % (ref 11.5–14.5)
WBC: 15 10*3/uL — ABNORMAL HIGH (ref 3.6–11.0)

## 2016-12-05 LAB — URINALYSIS, COMPLETE (UACMP) WITH MICROSCOPIC
Bilirubin Urine: NEGATIVE
Glucose, UA: NEGATIVE mg/dL
KETONES UR: NEGATIVE mg/dL
Nitrite: NEGATIVE
PH: 6 (ref 5.0–8.0)
Protein, ur: 30 mg/dL — AB
SPECIFIC GRAVITY, URINE: 1.017 (ref 1.005–1.030)

## 2016-12-05 LAB — LIPASE, BLOOD: LIPASE: 19 U/L (ref 11–51)

## 2016-12-05 MED ORDER — HYDROMORPHONE HCL 1 MG/ML IJ SOLN
1.0000 mg | Freq: Once | INTRAMUSCULAR | Status: AC
  Administered 2016-12-05: 1 mg via INTRAVENOUS
  Filled 2016-12-05: qty 1

## 2016-12-05 MED ORDER — ONDANSETRON HCL 4 MG/2ML IJ SOLN
4.0000 mg | Freq: Once | INTRAMUSCULAR | Status: AC
  Administered 2016-12-05: 4 mg via INTRAVENOUS
  Filled 2016-12-05: qty 2

## 2016-12-05 MED ORDER — CIPROFLOXACIN HCL 500 MG PO TABS
500.0000 mg | ORAL_TABLET | Freq: Two times a day (BID) | ORAL | 0 refills | Status: AC
Start: 2016-12-05 — End: 2016-12-15

## 2016-12-05 MED ORDER — CEFTRIAXONE SODIUM 1 G IJ SOLR
1.0000 g | Freq: Once | INTRAMUSCULAR | Status: AC
  Administered 2016-12-05: 1 g via INTRAVENOUS
  Filled 2016-12-05: qty 10

## 2016-12-05 MED ORDER — ONDANSETRON 4 MG PO TBDP: 4.0000 mg | ORAL_TABLET | Freq: Three times a day (TID) | ORAL | 0 refills | Status: AC | PRN

## 2016-12-05 MED ORDER — OXYCODONE-ACETAMINOPHEN 5-325 MG PO TABS
1.0000 | ORAL_TABLET | Freq: Once | ORAL | Status: AC
  Administered 2016-12-05: 1 via ORAL
  Filled 2016-12-05: qty 1

## 2016-12-05 MED ORDER — OXYCODONE-ACETAMINOPHEN 5-325 MG PO TABS: 1.0000 | ORAL_TABLET | Freq: Four times a day (QID) | ORAL | 0 refills | Status: AC | PRN

## 2016-12-05 NOTE — ED Notes (Signed)
First Nurse Note:  Patient arrives from [facility] via [facility] with back pain.

## 2016-12-05 NOTE — ED Provider Notes (Signed)
[facility] Emergency Department Provider Note       Time seen: ----------------------------------------- 9:07 AM on 12/05/2016 -----------------------------------------     I have reviewed the triage vital signs and the nursing notes.   HISTORY   Chief Complaint Flank Pain    HPI Darlene Franco is a 50 y.o. female who presents to the ED for left flank pain that radiates in the left lower quadrant. Patient reports history of kidney stones and this feels similarly. She has had some vomiting, pain has been persistent for the last week. Pain is 9 out of 10 in the left flank, nothing makes it better.   Past Medical History:  Diagnosis Date  . Cancer ([place])   . Depression   . Kidney stones     Patient Active Problem List   Diagnosis Date Noted  . Major depressive disorder, single episode, severe without psychotic features ([place])   . Severe major depression, single episode, without psychotic features ([place]) 08/12/2014  . Depression, major, single episode, severe ([place]) 08/11/2014  . Cocaine use 08/11/2014  . Marijuana abuse 08/11/2014  . Pain, dental 08/11/2014  . Dysmenorrhea 08/11/2014    Past Surgical History:  Procedure Laterality Date  . FRACTURE SURGERY      Allergies Codeine sulfate  Social History Social History  Substance Use Topics  . Smoking status: Current Every Day Smoker    Packs/day: 1.50    Years: 30.00    Types: Cigarettes  . Smokeless tobacco: Never Used  . Alcohol use Yes    Review of Systems Constitutional: Negative for fever. Eyes: Negative for vision changes ENT:  Negative for congestion, sore throat Cardiovascular: Negative for chest pain. Respiratory: Negative for shortness of breath. Gastrointestinal: positive for flank pain, nausea Genitourinary: Negative for dysuria. Musculoskeletal: Negative for back pain. Skin: Negative for rash. Neurological: Negative for headaches, focal weakness or  numbness.  All systems negative/normal/unremarkable except as stated in the HPI  ____________________________________________   PHYSICAL EXAM:  VITAL SIGNS: ED Triage Vitals  Enc Vitals Group     BP 12/05/16 0850 116/60     Pulse Rate 12/05/16 0850 100     Resp 12/05/16 0850 20     Temp 12/05/16 0850 98.9 F (37.2 C)     Temp Source 12/05/16 0850 Oral     SpO2 12/05/16 0850 97 %     Weight 12/05/16 0848 122 lb (55.3 kg)     Height 12/05/16 0848 5\' 4"  (1.626 m)     Head Circumference --      Peak Flow --      Pain Score 12/05/16 0847 9     Pain Loc --      Pain Edu? --      Excl. in [place]? --     Constitutional: Alert and oriented. Tearful, mild distress Eyes: Conjunctivae are normal. Normal extraocular movements. ENT   Head: Normocephalic and atraumatic.   Nose: No congestion/rhinnorhea.   Mouth/Throat: Mucous membranes are moist.   Neck: No stridor. Cardiovascular: Normal rate, regular rhythm. No murmurs, rubs, or gallops. Respiratory: Normal respiratory effort without tachypnea nor retractions. Breath sounds are clear and equal bilaterally. No wheezes/rales/rhonchi. Gastrointestinal:left flank tenderness, no rebound or guarding. Normal bowel sounds. Musculoskeletal: Nontender with normal range of motion in extremities. No lower extremity tenderness nor edema. Neurologic:  Normal speech and language. No gross focal neurologic deficits are appreciated.  Skin:  Skin is warm, dry and intact. No rash noted. Psychiatric: Mood and affect are normal.  Speech and behavior are normal.  ____________________________________________  ED COURSE:  Pertinent labs & imaging results that were available during my care of the patient were reviewed by me and considered in my medical decision making (see chart for details). Patient presents for flank pain, we will assess with labs and imaging as indicated.   Procedures ____________________________________________   LABS  (pertinent positives/negatives)  Labs Reviewed  CBC WITH DIFFERENTIAL/PLATELET - Abnormal; Notable for the following:       Result Value   WBC 15.0 (*)    Neutro Abs 10.8 (*)    Monocytes Absolute 1.5 (*)    All other components within normal limits  COMPREHENSIVE METABOLIC PANEL - Abnormal; Notable for the following:    Glucose, Bld 128 (*)    ALT 13 (*)    All other components within normal limits  URINALYSIS, COMPLETE (UACMP) WITH MICROSCOPIC - Abnormal; Notable for the following:    Color, Urine YELLOW (*)    APPearance CLOUDY (*)    Hgb urine dipstick MODERATE (*)    Protein, ur 30 (*)    Leukocytes, UA LARGE (*)    Bacteria, UA RARE (*)    Squamous Epithelial / LPF 0-5 (*)    All other components within normal limits  URINE CULTURE  LIPASE, BLOOD    RADIOLOGY Images were viewed by me  CT renal protocol IMPRESSION: 1. No definite explanation for the patient's left flank pain is seen. 2. Two right renal calculi. But no hydronephrosis is seen. 3. The appendix and terminal ileum are unremarkable. 4. Mild abdominal aortic atherosclerosis. ____________________________________________  FINAL ASSESSMENT AND PLAN  Pyelonephritis  Plan: Patient's labs and imaging were dictated above. Patient had presented for flank pain which appears to likely be from developing pyelonephritis. She was started on IV Rocephin and we have sent a urine culture. She'll be discharged on Cipro with pain medication and outpatient follow-up as needed   Earleen Newport, MD   Note: This note was generated in part or whole with voice recognition software. Voice recognition is usually quite accurate but there are transcription errors that can and very often do occur. I apologize for any typographical errors that were not detected and corrected.     Earleen Newport, MD 12/05/16 1134

## 2016-12-05 NOTE — ED Triage Notes (Signed)
Pt c/o left flank pain radiating around to LLQ. Hx kidney stones, feels same.  Has had some vomiting.  Pain has been for 1 week.  No hematuria. Sweats but has not checked temp. Has had to have lithotripsy before.

## 2016-12-08 LAB — URINE CULTURE
Culture: >=100000 — AB
SPECIAL REQUESTS: NORMAL

## 2016-12-09 ENCOUNTER — Telehealth: Payer: Self-pay

## 2016-12-09 NOTE — Telephone Encounter (Signed)
Post ED Visit - Positive Culture Follow-up  Culture report reviewed by antimicrobial stewardship pharmacist:  []  Elenor Quinones, Pharm.D. []  Heide Guile, Pharm.D., BCPS AQ-ID []  Parks Neptune, Pharm.D., BCPS []  Alycia Rossetti, Pharm.D., BCPS []  [place], Pharm.D., BCPS, AAHIVP []  Legrand Como, Pharm.D., BCPS, AAHIVP []  Salome Arnt, PharmD, BCPS []  Dimitri Ped, PharmD, BCPS []  Vincenza Hews, PharmD, BCPS Bem Mancheril Pharm D Positive urine culture Treated with Cephalexin, organism sensitive to the same and no further patient follow-up is required at this time.  Genia Del 12/09/2016, 11:34 AM

## 2021-03-11 ENCOUNTER — Emergency Department (HOSPITAL_COMMUNITY): Payer: Self-pay

## 2021-03-11 ENCOUNTER — Other Ambulatory Visit: Payer: Self-pay

## 2021-03-11 ENCOUNTER — Emergency Department (HOSPITAL_COMMUNITY)
Admission: EM | Admit: 2021-03-11 | Discharge: 2021-03-11 | Disposition: A | Discharge status: Home / Self Care | Payer: Self-pay | Attending: Emergency Medicine | Admitting: Emergency Medicine

## 2021-03-11 ENCOUNTER — Encounter (HOSPITAL_COMMUNITY): Payer: Self-pay

## 2021-03-11 DIAGNOSIS — M5136 Other intervertebral disc degeneration, lumbar region: Secondary | ICD-10-CM | POA: Insufficient documentation

## 2021-03-11 DIAGNOSIS — F17210 Nicotine dependence, cigarettes, uncomplicated: Secondary | ICD-10-CM | POA: Insufficient documentation

## 2021-03-11 DIAGNOSIS — R0789 Other chest pain: Secondary | ICD-10-CM | POA: Insufficient documentation

## 2021-03-11 DIAGNOSIS — Z859 Personal history of malignant neoplasm, unspecified: Secondary | ICD-10-CM | POA: Insufficient documentation

## 2021-03-11 DIAGNOSIS — R079 Chest pain, unspecified: Secondary | ICD-10-CM

## 2021-03-11 DIAGNOSIS — M545 Low back pain, unspecified: Secondary | ICD-10-CM | POA: Insufficient documentation

## 2021-03-11 DIAGNOSIS — E041 Nontoxic single thyroid nodule: Secondary | ICD-10-CM | POA: Insufficient documentation

## 2021-03-11 LAB — COMPREHENSIVE METABOLIC PANEL
ALT: 11 U/L (ref 0–44)
AST: 14 U/L — ABNORMAL LOW (ref 15–41)
Albumin: 4.3 g/dL (ref 3.5–5.0)
Alkaline Phosphatase: 67 U/L (ref 38–126)
Anion gap: 9 (ref 5–15)
BUN: 18 mg/dL (ref 6–20)
CO2: 22 mmol/L (ref 22–32)
Calcium: 9.3 mg/dL (ref 8.9–10.3)
Chloride: 107 mmol/L (ref 98–111)
Creatinine, Ser: 0.64 mg/dL (ref 0.44–1.00)
GFR, Estimated: >60 mL/min (ref >60)
Glucose, Bld: 113 mg/dL — ABNORMAL HIGH (ref 70–99)
Potassium: 3.7 mmol/L (ref 3.5–5.1)
Sodium: 138 mmol/L (ref 135–145)
Total Bilirubin: 0.2 mg/dL — ABNORMAL LOW (ref 0.3–1.2)
Total Protein: 7.3 g/dL (ref 6.5–8.1)

## 2021-03-11 LAB — CBC WITH DIFFERENTIAL/PLATELET
Abs Immature Granulocytes: 0.05 10*3/uL (ref 0.00–0.07)
Basophils Absolute: 0 10*3/uL (ref 0.0–0.1)
Basophils Relative: 0 %
Eosinophils Absolute: 0 10*3/uL (ref 0.0–0.5)
Eosinophils Relative: 0 %
HCT: 43.1 % (ref 36.0–46.0)
Hemoglobin: 14.7 g/dL (ref 12.0–15.0)
Immature Granulocytes: 0 %
Lymphocytes Relative: 24 %
Lymphs Abs: 2.8 10*3/uL (ref 0.7–4.0)
MCH: 29.4 pg (ref 26.0–34.0)
MCHC: 34.1 g/dL (ref 30.0–36.0)
MCV: 86.2 fL (ref 80.0–100.0)
Monocytes Absolute: 0.5 10*3/uL (ref 0.1–1.0)
Monocytes Relative: 4 %
Neutro Abs: 8.4 10*3/uL — ABNORMAL HIGH (ref 1.7–7.7)
Neutrophils Relative %: 72 %
Platelets: 406 10*3/uL — ABNORMAL HIGH (ref 150–400)
RBC: 5 MIL/uL (ref 3.87–5.11)
RDW: 12.6 % (ref 11.5–15.5)
WBC: 11.8 10*3/uL — ABNORMAL HIGH (ref 4.0–10.5)
nRBC: 0 % (ref 0.0–0.2)

## 2021-03-11 LAB — TROPONIN I (HIGH SENSITIVITY)
Troponin I (High Sensitivity): 5 ng/L (ref <18)
Troponin I (High Sensitivity): 7 ng/L (ref <18)

## 2021-03-11 LAB — D-DIMER, QUANTITATIVE: D-Dimer, Quant: 1.04 ug/mL-FEU — ABNORMAL HIGH (ref 0.00–0.50)

## 2021-03-11 LAB — LIPASE, BLOOD: Lipase: 28 U/L (ref 11–51)

## 2021-03-11 MED ORDER — CYCLOBENZAPRINE HCL 10 MG PO TABS
10.0000 mg | ORAL_TABLET | Freq: Once | ORAL | Status: AC
  Administered 2021-03-11: 10 mg via ORAL
  Filled 2021-03-11: qty 1

## 2021-03-11 MED ORDER — CYCLOBENZAPRINE HCL 10 MG PO TABS: 10.0000 mg | ORAL_TABLET | Freq: Two times a day (BID) | ORAL | 0 refills | Status: AC | PRN

## 2021-03-11 MED ORDER — KETOROLAC TROMETHAMINE 15 MG/ML IJ SOLN
15.0000 mg | Freq: Once | INTRAMUSCULAR | Status: AC
  Administered 2021-03-11: 15 mg via INTRAVENOUS
  Filled 2021-03-11: qty 1

## 2021-03-11 MED ORDER — IOHEXOL 350 MG/ML SOLN
75.0000 mL | Freq: Once | INTRAVENOUS | Status: AC | PRN
  Administered 2021-03-11: 75 mL via INTRAVENOUS

## 2021-03-11 NOTE — Discharge Instructions (Addendum)
Follow-up with local doctor. Use Tylenol every 4 hours and ibuprofen every 6 hours as needed for pain. Return for new concerns. Have your doctor order thyroid ultrasound for more detail of nodule, ideally in next month.

## 2021-03-11 NOTE — ED Triage Notes (Signed)
Reports back pain x 1 week that became worse this morning.  Reports that pt started having chest pain when her back pain became severe.  Reports called ems and then decided to come pov to hospital.  Rates back pain 10/10.  Reports chest pain intermittent.

## 2021-03-11 NOTE — ED Provider Notes (Signed)
[facility] EMERGENCY DEPARTMENT Provider Note   CSN: [id number] Arrival date & time: 03/11/21  1856     History Chief Complaint  Patient presents with   Chest Pain    Darlene Franco is a 55 y.o. female.  Patient with history of depression, cocaine use, marijuana use presents with low back.  And chest pain.  Patient had low back pain worsening past week after mechanical injury.  It came worse this morning and then she started having sudden bilateral anterior chest pain worse with breathing.  Patient rates back pain severe and chest pain moderate.  No history of cardiac history, blood clots, recent surgery, leg swelling.  No ACS history.  No exertional component.  Patient feels anxious about it.  Chest pain nonradiating.  Chest pain and back pain occur separately.      Past Medical History:  Diagnosis Date   Cancer [facility])    Depression    Kidney stones     Patient Active Problem List   Diagnosis Date Noted   Major depressive disorder, single episode, severe without psychotic features ([place])    Severe major depression, single episode, without psychotic features ([place]) 08/12/2014   Depression, major, single episode, severe ([place]) 08/11/2014   Cocaine use 08/11/2014   Marijuana abuse 08/11/2014   Pain, dental 08/11/2014   Dysmenorrhea 08/11/2014    Past Surgical History:  Procedure Laterality Date   FRACTURE SURGERY       OB History   No obstetric history on file.     Family History  Problem Relation Age of Onset   Suicidality Other    Suicidality Cousin     Social History   Tobacco Use   Smoking status: Every Day    Packs/day: 1.50    Years: 30.00    Pack years: 45.00    Types: Cigarettes   Smokeless tobacco: Never  Substance Use Topics   Alcohol use: Yes   Drug use: Yes    Types: Marijuana, Cocaine, Benzodiazepines    Home Medications Prior to Admission medications   Medication Sig Start Date End Date Taking? Authorizing Provider  Aspirin-Caffeine  (BAYER BACK & BODY PO) Take 1 tablet by mouth as needed (pain).   Yes [provider]  mirtazapine (REMERON SOL-TAB) 15 MG disintegrating tablet Take 1 tablet (15 mg total) by mouth at bedtime. Patient not taking: Reported on 12/05/2016 08/14/14   Pucilowska, Herma Ard B, MD  ondansetron (ZOFRAN ODT) 4 MG disintegrating tablet Take 1 tablet (4 mg total) by mouth every 8 (eight) hours as needed for nausea or vomiting. Patient not taking: Reported on 03/11/2021 12/05/16   Earleen Newport, MD  oxyCODONE-acetaminophen (PERCOCET) 5-325 MG tablet Take 1-2 tablets by mouth every 6 (six) hours as needed. Patient not taking: Reported on 03/11/2021 12/05/16   Earleen Newport, MD    Allergies    Codeine sulfate  Review of Systems   Review of Systems  Constitutional:  Negative for chills and fever.  HENT:  Negative for congestion.   Eyes:  Negative for visual disturbance.  Respiratory:  Negative for shortness of breath.   Cardiovascular:  Positive for chest pain.  Gastrointestinal:  Negative for abdominal pain and vomiting.  Genitourinary:  Negative for dysuria and flank pain.  Musculoskeletal:  Positive for back pain. Negative for neck pain and neck stiffness.  Skin:  Negative for rash.  Neurological:  Negative for light-headedness and headaches.  Psychiatric/Behavioral:  The patient is nervous/anxious.    Physical Exam Updated  Vital Signs BP 127/70   Pulse 69   Temp 98.6 F (37 C)   Resp (!) 25   Ht 5\' 4"  (1.626 m)   Wt 48.5 kg   SpO2 99%   BMI 18.37 kg/m   Physical Exam Vitals and nursing note reviewed.  Constitutional:      General: She is not in acute distress.    Appearance: She is well-developed.  HENT:     Head: Normocephalic and atraumatic.     Mouth/Throat:     Mouth: Mucous membranes are moist.  Eyes:     General:        Right eye: No discharge.        Left eye: No discharge.     Conjunctiva/sclera: Conjunctivae normal.  Neck:     Trachea: No tracheal  deviation.  Cardiovascular:     Rate and Rhythm: Normal rate and regular rhythm.     Heart sounds: No murmur heard. Pulmonary:     Effort: Pulmonary effort is normal.     Breath sounds: Normal breath sounds.  Abdominal:     General: There is no distension.     Palpations: Abdomen is soft.     Tenderness: There is no abdominal tenderness. There is no guarding.  Musculoskeletal:     Cervical back: Normal range of motion and neck supple. No rigidity.     Right lower leg: No tenderness. No edema.     Left lower leg: No tenderness. No edema.     Comments: Patient has moderate tenderness paraspinal and midline no step-off lumbar region.  No thoracic tenderness midline.  Normal strength 5+ lower extremity flexion extension of major joints gross sensation intact palpation.  Skin:    General: Skin is warm.     Capillary Refill: Capillary refill takes less than 2 seconds.     Findings: No rash.  Neurological:     General: No focal deficit present.     Mental Status: She is alert.     Cranial Nerves: No cranial nerve deficit.  Psychiatric:        Mood and Affect: Mood is anxious.    ED Results / Procedures / Treatments   Labs (all labs ordered are listed, but only abnormal results are displayed) Labs Reviewed  COMPREHENSIVE METABOLIC PANEL - Abnormal; Notable for the following components:      Result Value   Glucose, Bld 113 (*)    AST 14 (*)    Total Bilirubin 0.2 (*)    All other components within normal limits  CBC WITH DIFFERENTIAL/PLATELET - Abnormal; Notable for the following components:   WBC 11.8 (*)    Platelets 406 (*)    Neutro Abs 8.4 (*)    All other components within normal limits  D-DIMER, QUANTITATIVE - Abnormal; Notable for the following components:   D-Dimer, Quant 1.04 (*)    All other components within normal limits  LIPASE, BLOOD  TROPONIN I (HIGH SENSITIVITY)  TROPONIN I (HIGH SENSITIVITY)    EKG None  Radiology DG Lumbar Spine Complete  Result  Date: 03/11/2021 CLINICAL DATA:  Back pain EXAM: LUMBAR SPINE - COMPLETE 4+ VIEW COMPARISON:  None. FINDINGS: No acute fracture or spondylolisthesis identified. No spondylolysis visualized. Moderate intervertebral disc space narrowing at L5-S1 with endplate sclerosis. Facet arthropathy most significant from L4-S1. IMPRESSION: Degenerative changes with no acute fracture identified. Electronically Signed   By: Ofilia Neas M.D.   On: 03/11/2021 11:04   CT Angio Chest PE W and/or  Wo Contrast  Result Date: 03/11/2021 CLINICAL DATA:  Chest pain, elevated D-dimer EXAM: CT ANGIOGRAPHY CHEST WITH CONTRAST TECHNIQUE: Multidetector CT imaging of the chest was performed using the standard protocol during bolus administration of intravenous contrast. Multiplanar CT image reconstructions and MIPs were obtained to evaluate the vascular anatomy. CONTRAST:  34mL OMNIPAQUE IOHEXOL 350 MG/ML SOLN COMPARISON:  Same-day chest radiograph FINDINGS: Cardiovascular: There is adequate opacification of the pulmonary arteries to the subsegmental level. There is no evidence of pulmonary embolism. The thoracic aorta is normal in appearance. The heart is not enlarged. There is no pericardial effusion. Mediastinum/Nodes: There is a 1.6 cm partially calcified right thyroid nodule. The esophagus is grossly unremarkable. There is no mediastinal, hilar, or axillary lymphadenopathy. Lungs/Pleura: The trachea and central airways are patent. There is no focal consolidation or pulmonary edema. There is no pleural effusion or pneumothorax. There is mild biapical scarring. There is a small calcified granuloma in the right lower lobe (6-93). There is no suspicious nodule. The finding on the same-day chest radiograph was likely artifactual. Upper Abdomen: There is a 1.2 cm hypodense lesion in the liver dome, significantly decreased in size compared to the study from 12/05/2016. A few additional subcentimeter hypodense lesions in the liver are again  seen, too small to characterize, but likely small cysts. Imaged portions of the upper abdominal viscera are otherwise unremarkable. Musculoskeletal: There is mild compression deformity of the T5 through T9 and T12 vertebral bodies, likely chronic. There is no evidence of acute osseous abnormality or aggressive osseous lesion. Review of the MIP images confirms the above findings. IMPRESSION: 1. No acute pulmonary embolism or other acute cardiopulmonary process. 2. No suspicious pulmonary nodules. The finding on the same-day chest radiograph was likely artifactual. 3. Mild compression deformity of multiple thoracic vertebral bodies, likely chronic. 4. 1.6 cm incidental right thyroid nodule. Recommend thyroid nonemergent ultrasound. Reference: J Am Coll Radiol. 2015 Feb;12(2): 143-50 Electronically Signed   By: Valetta Mole M.D.   On: 03/11/2021 14:05   DG Chest Portable 1 View  Result Date: 03/11/2021 CLINICAL DATA:  Back pain for 1 week the became worse this morning. A also associated with chest pain. EXAM: PORTABLE CHEST 1 VIEW COMPARISON:  January of 2011. FINDINGS: EKG leads project over the chest. Cardiomediastinal contours and hilar structures are normal. No consolidation or sign of pleural effusion. Nodular density in the RIGHT chest could represent confluence of shadows measuring approximately 11 mm in the mid lateral RIGHT chest in the seventh intercostal space. It is unclear whether this represents confluence of shadows. No pneumothorax. No sign of pleural effusion. On limited assessment there is no acute skeletal process. IMPRESSION: Nodular density in the RIGHT chest could represent confluence of shadows, consider PA and lateral chest x-ray or CT for initial assessment when the patient is able to exclude underlying nodule. No acute cardiopulmonary process. Electronically Signed   By: Zetta Bills M.D.   On: 03/11/2021 11:06    Procedures Procedures   Medications Ordered in ED Medications   ketorolac (TORADOL) 15 MG/ML injection 15 mg (15 mg Intravenous Given 03/11/21 1051)  iohexol (OMNIPAQUE) 350 MG/ML injection 75 mL (75 mLs Intravenous Contrast Given 03/11/21 1345)    ED Course  I have reviewed the triage vital signs and the nursing notes.  Pertinent labs & imaging results that were available during my care of the patient were reviewed by me and considered in my medical decision making (see chart for details).    MDM Rules/Calculators/A&P  Patient presents with worsening back pain for 1 week clinically musculoskeletal worse with palpation midline paraspinal with normal strength and sensation lower.  Plan for screening x-rays to assure proper alignment and no fractures.  Patient also has chest pain and anxiety, tearful in the room and tachypnea.  Patient is low risk for ACS and blood clots, plan for 2 troponins and D-dimer.  Pain meds ordered.  Blood work pending.  EKG reviewed no acute signs of ischemia sinus rhythm.  Chest x-ray pending.  Blood work reviewed overall unremarkable white blood cell count minimally elevated 11, normal hemoglobin, electrolytes overall unremarkable, normal liver function.  Troponin negative x2.  D-dimer mild elevated.  CT scan of the chest ordered and reviewed no acute blood clot however mild/small thyroid nodule discovered.  Patient needs outpatient ultrasound for this I discussed this with her and put in her discharge paperwork.  Vital signs normalized.  Patient low risk cardiac and can follow-up outpatient.  Lumbar x-ray no acute fracture showed degeneration.  Patient stable for discharge.   Final Clinical Impression(s) / ED Diagnoses Final diagnoses:  Acute bilateral low back pain without sciatica  Acute chest pain  Right thyroid nodule  Degeneration, intervertebral disc, lumbar    Rx / DC Orders ED Discharge Orders     None        Elnora Morrison, MD 03/11/21 1423

## 2021-04-11 ENCOUNTER — Encounter (HOSPITAL_COMMUNITY): Payer: Self-pay | Admitting: Radiology

## 2023-01-24 IMAGING — CT CT ANGIO CHEST
2 of 6 series · 18 of 36 positions shown · IV contrast (Omnipaque or Isovue)
Comparison: Same-day chest radiograph

CLINICAL DATA: Chest pain, elevated D-dimer

EXAM:
CT ANGIOGRAPHY CHEST WITH CONTRAST
TECHNIQUE: Multidetector CT imaging of the chest was performed using the
standard protocol during bolus administration of intravenous
contrast. Multiplanar CT image reconstructions and MIPs were
obtained to evaluate the vascular anatomy.
CONTRAST:  75mL OMNIPAQUE IOHEXOL 350 MG/ML SOLN

[Series 5: pe axial thins · axial · 0.58mm/px · z∈[+1316,+1588]mm · 17 of 376 slices shown]
[im 18/376  lung]
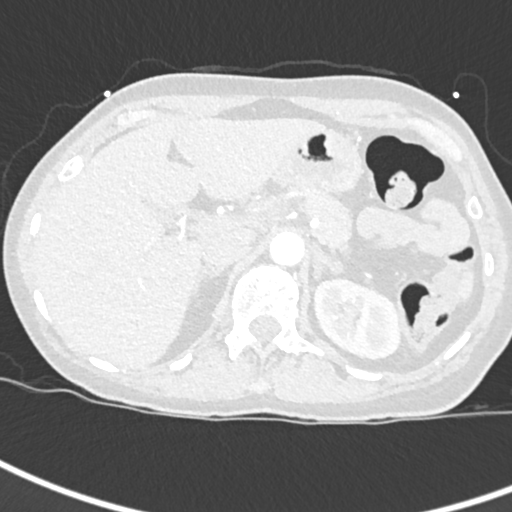
[im 36/376  mediastinal]
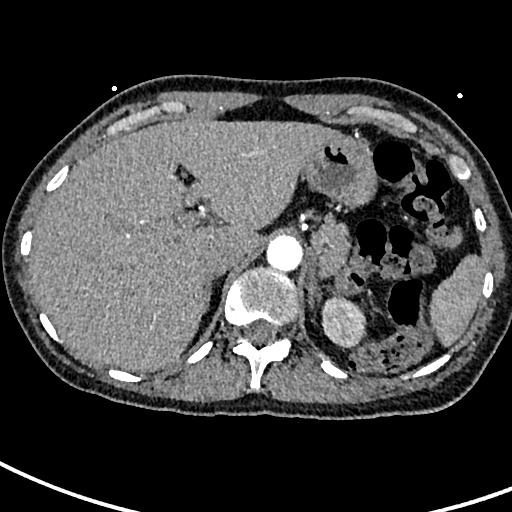
[im 54/376  lung]
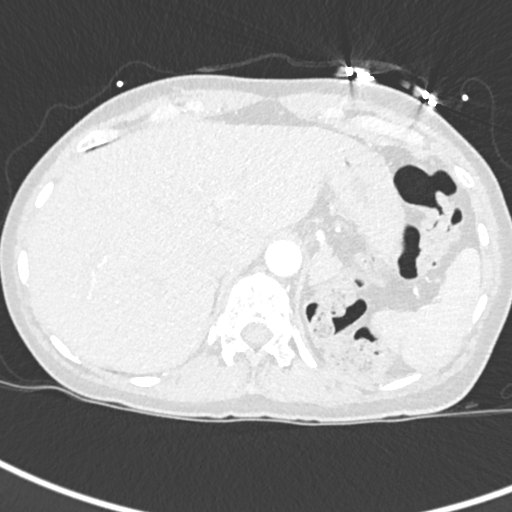
[im 90/376  mediastinal]
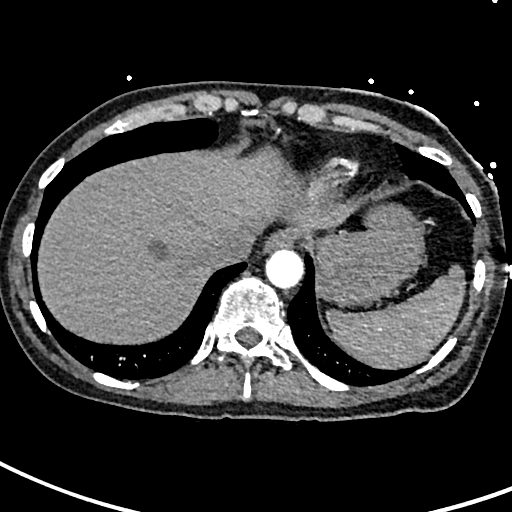
[im 108/376  lung]
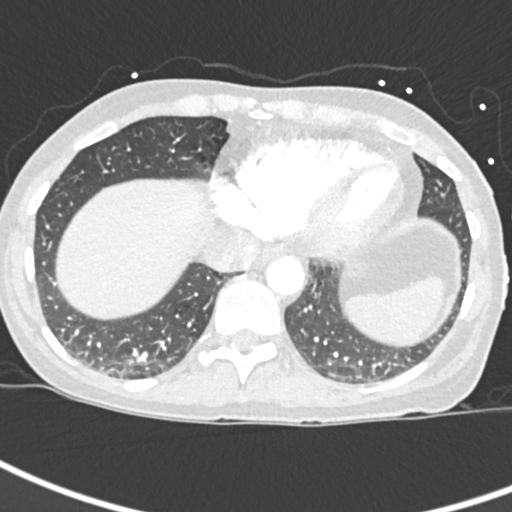
[im 126/376  mediastinal]
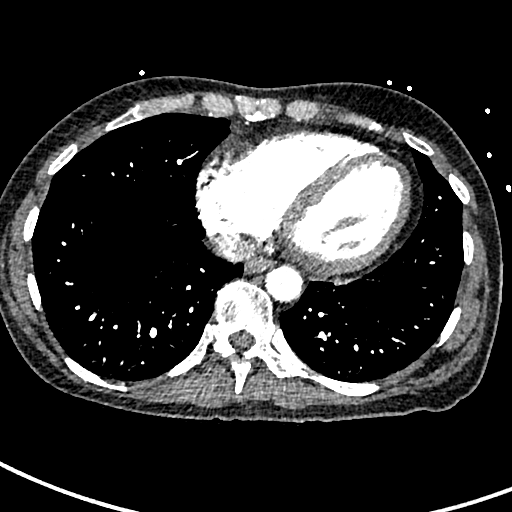
[im 143/376  lung]
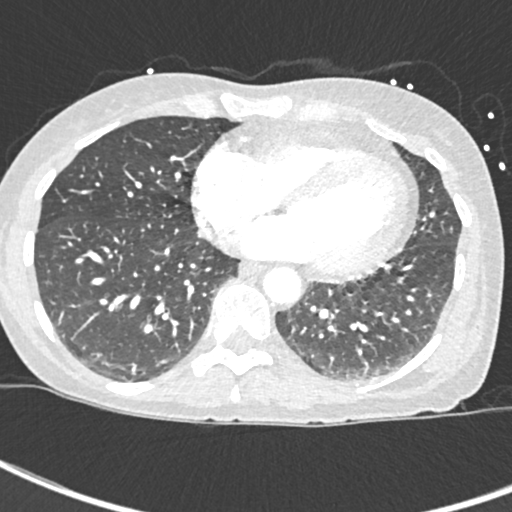
[im 161/376  mediastinal]
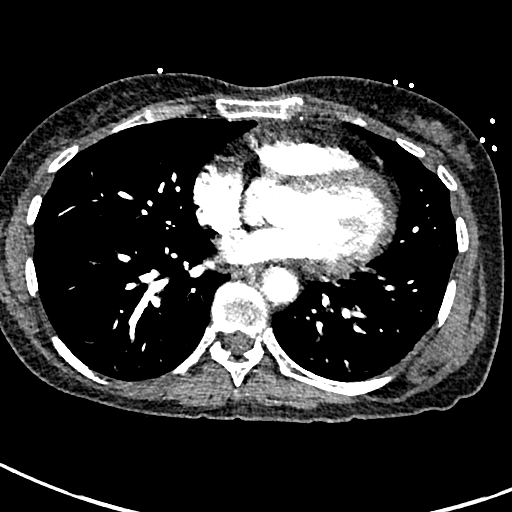
[im 197/376  lung]
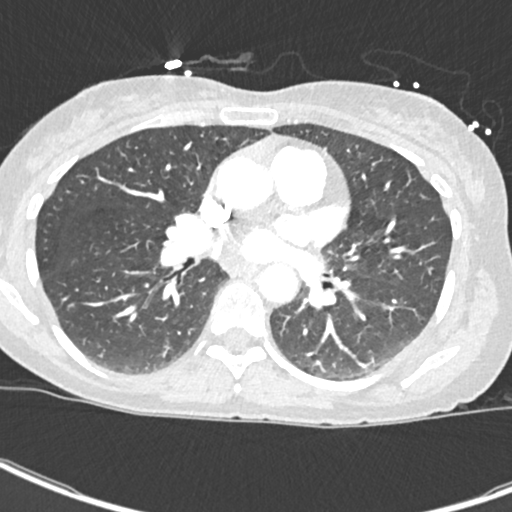
[im 215/376  mediastinal]
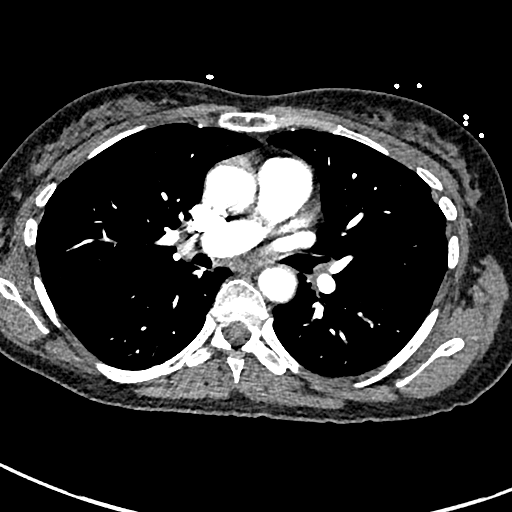
[im 233/376  lung]
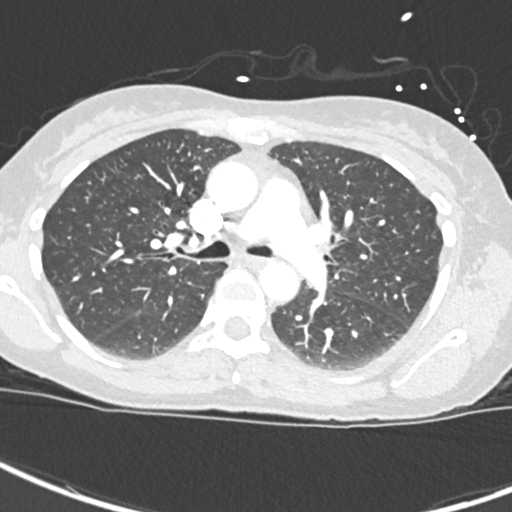
[im 251/376  mediastinal]
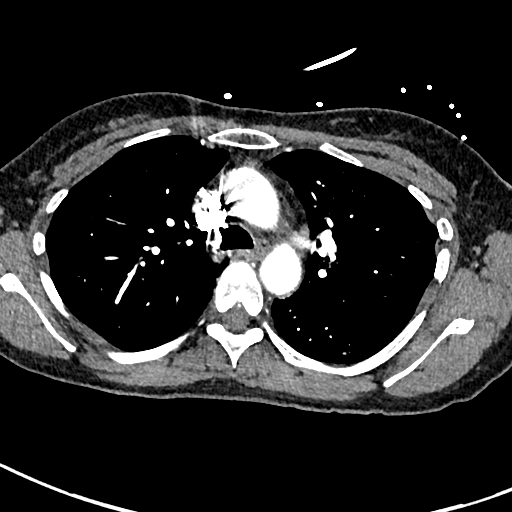
[im 268/376  lung]
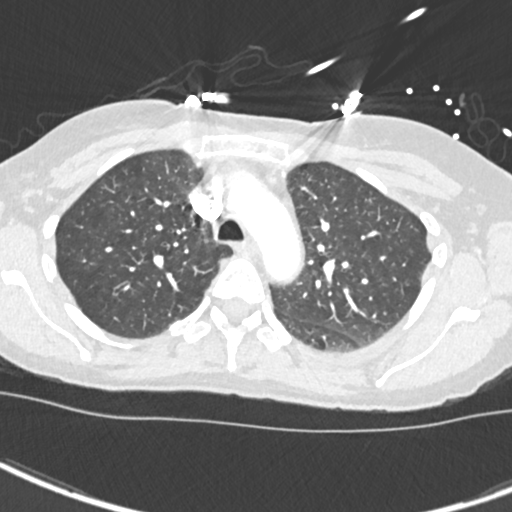
[im 286/376  mediastinal]
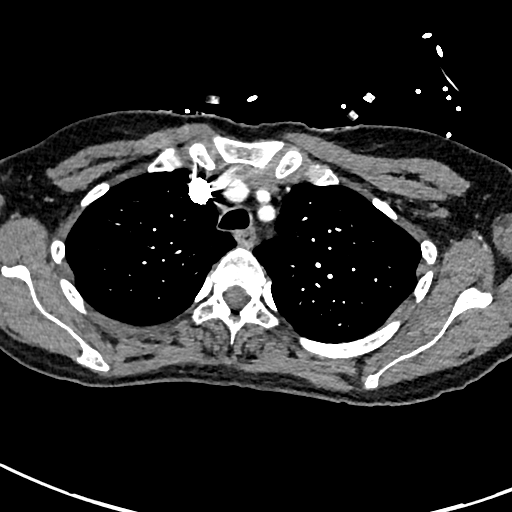
[im 322/376  lung]
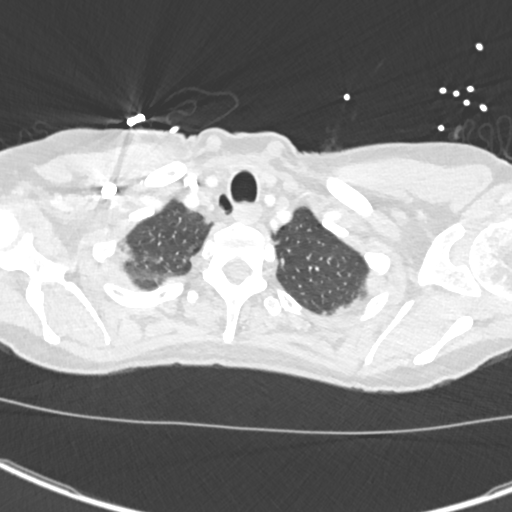
[im 340/376  mediastinal]
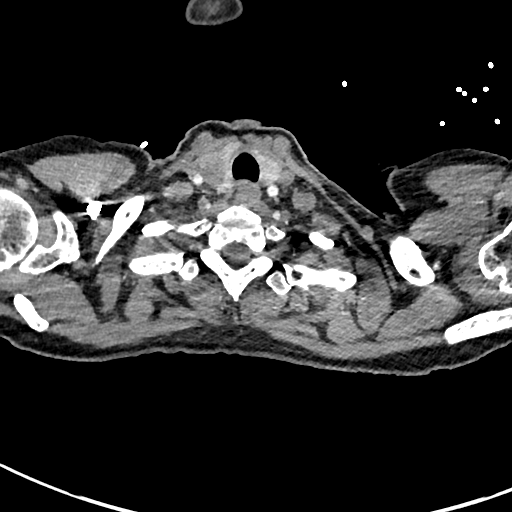
[im 358/376  lung]
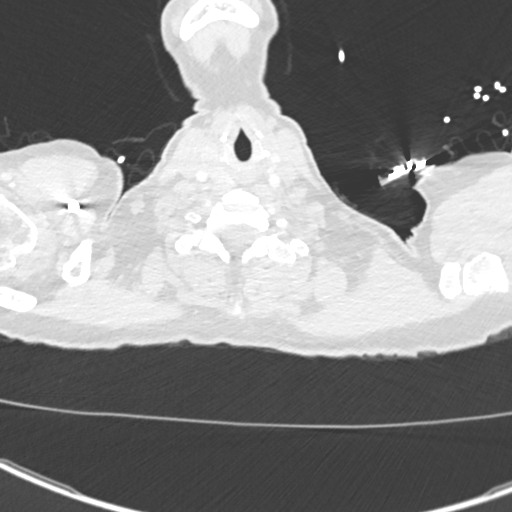

[Series 7: cor soft · coronal · 0.62mm/px · 1 of 108 slices shown]
[im 54/108  mediastinal]
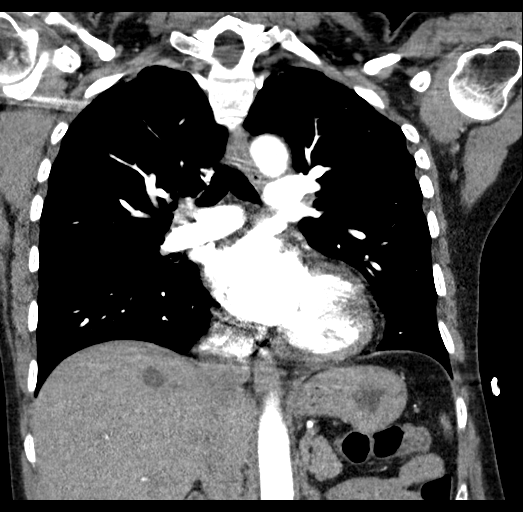

[18 of 36 positions shown; findings below may reference images not displayed]

FINDINGS: Cardiovascular: There is adequate opacification of the pulmonary
arteries to the subsegmental level. There is no evidence of
pulmonary embolism. The thoracic aorta is normal in appearance. The
heart is not enlarged. There is no pericardial effusion.

Mediastinum/Nodes: There is a 1.6 cm partially calcified right
thyroid nodule. The esophagus is grossly unremarkable. There is no
mediastinal, hilar, or axillary lymphadenopathy.

Lungs/Pleura: The trachea and central airways are patent.

There is no focal consolidation or pulmonary edema. There is no
pleural effusion or pneumothorax. There is mild biapical scarring.

There is a small calcified granuloma in the right lower lobe (6-93).
There is no suspicious nodule. The finding on the same-day chest
radiograph was likely artifactual.

Upper Abdomen: There is a 1.2 cm hypodense lesion in the liver dome,
significantly decreased in size compared to the study from
12/05/2016. A few additional subcentimeter hypodense lesions in the
liver are again seen, too small to characterize, but likely small
cysts. Imaged portions of the upper abdominal viscera are otherwise
unremarkable.

Musculoskeletal: There is mild compression deformity of the T5
through T9 and T12 vertebral bodies, likely chronic. There is no
evidence of acute osseous abnormality or aggressive osseous lesion.

Review of the MIP images confirms the above findings.
IMPRESSION: 1. No acute pulmonary embolism or other acute cardiopulmonary
process.
2. No suspicious pulmonary nodules. The finding on the same-day
chest radiograph was likely artifactual.
3. Mild compression deformity of multiple thoracic vertebral bodies,
likely chronic.
4. 1.6 cm incidental right thyroid nodule. Recommend thyroid
nonemergent ultrasound. Reference: [HOSPITAL]. [DATE]):
